# Patient Record
Sex: Female | Born: 1945 | Race: Black or African American | Hispanic: No | Marital: Single | Smoking: Current every day smoker
Health system: Southern US, Community
[De-identification: ages and names within clinical notes are randomized; demographics above are authoritative.]

## PROBLEM LIST (undated history)

## (undated) DIAGNOSIS — K859 Acute pancreatitis without necrosis or infection, unspecified: Secondary | ICD-10-CM

## (undated) DIAGNOSIS — E119 Type 2 diabetes mellitus without complications: Secondary | ICD-10-CM

## (undated) DIAGNOSIS — I10 Essential (primary) hypertension: Secondary | ICD-10-CM

## (undated) DIAGNOSIS — E7800 Pure hypercholesterolemia, unspecified: Secondary | ICD-10-CM

## (undated) HISTORY — PX: THYROGLOSSAL DUCT CYST: SHX297

## (undated) HISTORY — DX: Acute pancreatitis without necrosis or infection, unspecified: K85.90

## (undated) HISTORY — PX: SHOULDER OPEN ROTATOR CUFF REPAIR: SHX2407

---

## 1984-05-13 HISTORY — PX: ABDOMINAL HYSTERECTOMY: SHX81

## 2011-11-06 ENCOUNTER — Encounter (INDEPENDENT_AMBULATORY_CARE_PROVIDER_SITE_OTHER): Payer: Medicare Other | Admitting: Ophthalmology

## 2011-11-06 DIAGNOSIS — H43819 Vitreous degeneration, unspecified eye: Secondary | ICD-10-CM

## 2011-11-06 DIAGNOSIS — H2510 Age-related nuclear cataract, unspecified eye: Secondary | ICD-10-CM

## 2011-11-06 DIAGNOSIS — H3509 Other intraretinal microvascular abnormalities: Secondary | ICD-10-CM

## 2011-11-06 DIAGNOSIS — H4310 Vitreous hemorrhage, unspecified eye: Secondary | ICD-10-CM

## 2011-11-08 DIAGNOSIS — H4310 Vitreous hemorrhage, unspecified eye: Secondary | ICD-10-CM

## 2011-11-08 NOTE — H&P (Signed)
Stacy Hinton is an 66 y.o. female.   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  No past surgical history on file.  No family history on file. Social History:  does not have a smoking history on file. She does not have any smokeless tobacco history on file. Her alcohol and drug histories not on file.  Allergies: Allergies not on file  No prescriptions prior to admission    Review of systems otherwise negative  There were no vitals taken for this visit.  Physical exam: Mental status: oriented x3. Eyes: See eye exam associated with this date of surgery in media tab.  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

## 2011-11-12 ENCOUNTER — Encounter (HOSPITAL_COMMUNITY): Payer: Self-pay | Admitting: Pharmacy Technician

## 2011-11-18 ENCOUNTER — Encounter (HOSPITAL_COMMUNITY): Payer: Self-pay | Admitting: *Deleted

## 2011-11-18 MED ORDER — GATIFLOXACIN 0.5 % OP SOLN
1.0000 [drp] | OPHTHALMIC | Status: AC | PRN
  Administered 2011-11-19 (×3): 1 [drp] via OPHTHALMIC
  Filled 2011-11-18: qty 2.5

## 2011-11-18 MED ORDER — PHENYLEPHRINE HCL 2.5 % OP SOLN
1.0000 [drp] | OPHTHALMIC | Status: AC | PRN
  Administered 2011-11-19 (×3): 1 [drp] via OPHTHALMIC
  Filled 2011-11-18: qty 3

## 2011-11-18 MED ORDER — CYCLOPENTOLATE HCL 1 % OP SOLN
1.0000 [drp] | OPHTHALMIC | Status: AC | PRN
  Administered 2011-11-19 (×3): 1 [drp] via OPHTHALMIC
  Filled 2011-11-18: qty 2

## 2011-11-18 MED ORDER — CEFAZOLIN SODIUM-DEXTROSE 2-3 GM-% IV SOLR
2.0000 g | INTRAVENOUS | Status: AC
  Administered 2011-11-19: 2 g via INTRAVENOUS
  Filled 2011-11-18: qty 50

## 2011-11-18 MED ORDER — TROPICAMIDE 1 % OP SOLN
1.0000 [drp] | OPHTHALMIC | Status: AC | PRN
  Administered 2011-11-19 (×3): 1 [drp] via OPHTHALMIC
  Filled 2011-11-18 (×2): qty 3

## 2011-11-19 ENCOUNTER — Encounter (HOSPITAL_COMMUNITY)
Admission: RE | Disposition: A | Discharge status: Home / Self Care | Payer: Self-pay | Source: Ambulatory Visit | Attending: Ophthalmology

## 2011-11-19 ENCOUNTER — Ambulatory Visit (HOSPITAL_COMMUNITY): Payer: Medicare Other | Admitting: Certified Registered"

## 2011-11-19 ENCOUNTER — Ambulatory Visit (HOSPITAL_COMMUNITY)
Admission: RE | Admit: 2011-11-19 | Discharge: 2011-11-20 | Disposition: A | Discharge status: Home / Self Care | Payer: Medicare Other | Source: Ambulatory Visit | Attending: Ophthalmology | Admitting: Ophthalmology

## 2011-11-19 ENCOUNTER — Ambulatory Visit (HOSPITAL_COMMUNITY): Payer: Medicare Other

## 2011-11-19 ENCOUNTER — Encounter (HOSPITAL_COMMUNITY): Payer: Self-pay | Admitting: Certified Registered"

## 2011-11-19 ENCOUNTER — Encounter (HOSPITAL_COMMUNITY): Payer: Self-pay | Admitting: *Deleted

## 2011-11-19 DIAGNOSIS — E109 Type 1 diabetes mellitus without complications: Secondary | ICD-10-CM | POA: Insufficient documentation

## 2011-11-19 DIAGNOSIS — H3560 Retinal hemorrhage, unspecified eye: Secondary | ICD-10-CM

## 2011-11-19 DIAGNOSIS — I10 Essential (primary) hypertension: Secondary | ICD-10-CM | POA: Insufficient documentation

## 2011-11-19 DIAGNOSIS — H4310 Vitreous hemorrhage, unspecified eye: Secondary | ICD-10-CM | POA: Insufficient documentation

## 2011-11-19 DIAGNOSIS — Z23 Encounter for immunization: Secondary | ICD-10-CM | POA: Insufficient documentation

## 2011-11-19 HISTORY — DX: Pure hypercholesterolemia, unspecified: E78.00

## 2011-11-19 HISTORY — PX: GAS/FLUID EXCHANGE: SHX5334

## 2011-11-19 HISTORY — DX: Type 2 diabetes mellitus without complications: E11.9

## 2011-11-19 HISTORY — PX: PARS PLANA VITRECTOMY: SHX2166

## 2011-11-19 HISTORY — DX: Essential (primary) hypertension: I10

## 2011-11-19 LAB — BASIC METABOLIC PANEL
CO2: 26 mEq/L (ref 19–32)
Calcium: 10 mg/dL (ref 8.4–10.5)
GFR calc Af Amer: 59 mL/min — ABNORMAL LOW (ref >90)
GFR calc non Af Amer: 51 mL/min — ABNORMAL LOW (ref >90)
Sodium: 140 mEq/L (ref 135–145)

## 2011-11-19 LAB — SURGICAL PCR SCREEN
MRSA, PCR: NEGATIVE
Staphylococcus aureus: NEGATIVE

## 2011-11-19 LAB — CBC
Platelets: 264 10*3/uL (ref 150–400)
RBC: 4.39 MIL/uL (ref 3.87–5.11)
RDW: 13.3 % (ref 11.5–15.5)
WBC: 4.6 10*3/uL (ref 4.0–10.5)

## 2011-11-19 LAB — GLUCOSE, CAPILLARY: Glucose-Capillary: 104 mg/dL — ABNORMAL HIGH (ref 70–99)

## 2011-11-19 SURGERY — PARS PLANA VITRECTOMY WITH 25 GAUGE
Anesthesia: General | Site: Eye | Laterality: Left | Wound class: Clean

## 2011-11-19 MED ORDER — SODIUM CHLORIDE 0.45 % IV SOLN
INTRAVENOUS | Status: DC
  Administered 2011-11-19: 15:00:00 via INTRAVENOUS

## 2011-11-19 MED ORDER — ACETAMINOPHEN 325 MG PO TABS: 325.0000 mg | ORAL_TABLET | ORAL | Status: DC | PRN

## 2011-11-19 MED ORDER — ONDANSETRON HCL 4 MG/2ML IJ SOLN: 4.0000 mg | Freq: Four times a day (QID) | INTRAMUSCULAR | Status: DC | PRN

## 2011-11-19 MED ORDER — BSS PLUS IO SOLN
INTRAOCULAR | Status: AC
  Filled 2011-11-19: qty 500

## 2011-11-19 MED ORDER — PNEUMOCOCCAL VAC POLYVALENT 25 MCG/0.5ML IJ INJ
0.5000 mL | INJECTION | INTRAMUSCULAR | Status: AC
  Administered 2011-11-20: 0.5 mL via INTRAMUSCULAR
  Filled 2011-11-19: qty 0.5

## 2011-11-19 MED ORDER — PROVISC 10 MG/ML IO SOLN
INTRAOCULAR | Status: DC | PRN
  Administered 2011-11-19: .85 mL via INTRAOCULAR

## 2011-11-19 MED ORDER — POLYMYXIN B SULFATE 500000 UNITS IJ SOLR
INTRAMUSCULAR | Status: AC
  Filled 2011-11-19: qty 1

## 2011-11-19 MED ORDER — PHENYLEPHRINE HCL 10 MG/ML IJ SOLN
INTRAMUSCULAR | Status: DC | PRN
  Administered 2011-11-19: 40 ug via INTRAVENOUS

## 2011-11-19 MED ORDER — KETOROLAC TROMETHAMINE 0.4 % OP SOLN: 1.0000 [drp] | Freq: Three times a day (TID) | OPHTHALMIC | Status: DC

## 2011-11-19 MED ORDER — BSS IO SOLN
INTRAOCULAR | Status: AC
  Filled 2011-11-19: qty 15

## 2011-11-19 MED ORDER — ONDANSETRON HCL 4 MG/2ML IJ SOLN: 4.0000 mg | Freq: Once | INTRAMUSCULAR | Status: DC | PRN

## 2011-11-19 MED ORDER — ACETAZOLAMIDE SODIUM 500 MG IJ SOLR
INTRAMUSCULAR | Status: AC
  Filled 2011-11-19: qty 500

## 2011-11-19 MED ORDER — ROCURONIUM BROMIDE 100 MG/10ML IV SOLN
INTRAVENOUS | Status: DC | PRN
  Administered 2011-11-19: 50 mg via INTRAVENOUS

## 2011-11-19 MED ORDER — GATIFLOXACIN 0.5 % OP SOLN
1.0000 [drp] | Freq: Four times a day (QID) | OPHTHALMIC | Status: DC
  Filled 2011-11-19: qty 2.5

## 2011-11-19 MED ORDER — BACITRACIN-POLYMYXIN B 500-10000 UNIT/GM OP OINT
TOPICAL_OINTMENT | OPHTHALMIC | Status: DC | PRN
  Administered 2011-11-19: 1 via OPHTHALMIC

## 2011-11-19 MED ORDER — HEMOSTATIC AGENTS (NO CHARGE) OPTIME
TOPICAL | Status: DC | PRN
  Administered 2011-11-19: 1 via TOPICAL

## 2011-11-19 MED ORDER — DOCUSATE SODIUM 100 MG PO CAPS
100.0000 mg | ORAL_CAPSULE | Freq: Two times a day (BID) | ORAL | Status: DC
  Administered 2011-11-19: 100 mg via ORAL
  Filled 2011-11-19: qty 1

## 2011-11-19 MED ORDER — EPINEPHRINE HCL 1 MG/ML IJ SOLN
INTRAMUSCULAR | Status: AC
  Filled 2011-11-19: qty 1

## 2011-11-19 MED ORDER — MORPHINE SULFATE 2 MG/ML IJ SOLN: 1.0000 mg | INTRAMUSCULAR | Status: DC | PRN

## 2011-11-19 MED ORDER — HYPROMELLOSE (GONIOSCOPIC) 2.5 % OP SOLN
OPHTHALMIC | Status: AC
  Filled 2011-11-19: qty 15

## 2011-11-19 MED ORDER — ACETAZOLAMIDE SODIUM 500 MG IJ SOLR
500.0000 mg | Freq: Once | INTRAMUSCULAR | Status: AC
  Administered 2011-11-20: 500 mg via INTRAVENOUS
  Filled 2011-11-19: qty 500

## 2011-11-19 MED ORDER — DEXAMETHASONE SODIUM PHOSPHATE 10 MG/ML IJ SOLN
INTRAMUSCULAR | Status: DC | PRN
  Administered 2011-11-19: 10 mg

## 2011-11-19 MED ORDER — METFORMIN HCL 500 MG PO TABS
500.0000 mg | ORAL_TABLET | Freq: Two times a day (BID) | ORAL | Status: DC
  Administered 2011-11-19 – 2011-11-20 (×2): 500 mg via ORAL
  Filled 2011-11-19 (×4): qty 1

## 2011-11-19 MED ORDER — HYALURONIDASE HUMAN 150 UNIT/ML IJ SOLN
INTRAMUSCULAR | Status: AC
  Filled 2011-11-19: qty 1

## 2011-11-19 MED ORDER — LIDOCAINE HCL 2 % IJ SOLN
INTRAMUSCULAR | Status: AC
  Filled 2011-11-19: qty 1

## 2011-11-19 MED ORDER — INSULIN ASPART 100 UNIT/ML ~~LOC~~ SOLN
0.0000 [IU] | SUBCUTANEOUS | Status: DC
  Administered 2011-11-19 (×2): 5 [IU] via SUBCUTANEOUS
  Administered 2011-11-20 (×2): 2 [IU] via SUBCUTANEOUS
  Administered 2011-11-20: 3 [IU] via SUBCUTANEOUS

## 2011-11-19 MED ORDER — MAGNESIUM HYDROXIDE 400 MG/5ML PO SUSP: 15.0000 mL | Freq: Four times a day (QID) | ORAL | Status: DC | PRN

## 2011-11-19 MED ORDER — SODIUM CHLORIDE 0.9 % IJ SOLN
INTRAMUSCULAR | Status: DC | PRN
  Administered 2011-11-19: 13:00:00

## 2011-11-19 MED ORDER — DEXAMETHASONE SODIUM PHOSPHATE 10 MG/ML IJ SOLN
INTRAMUSCULAR | Status: AC
  Filled 2011-11-19: qty 1

## 2011-11-19 MED ORDER — TETRACAINE HCL 0.5 % OP SOLN
2.0000 [drp] | Freq: Once | OPHTHALMIC | Status: DC
  Filled 2011-11-19: qty 2

## 2011-11-19 MED ORDER — BUPIVACAINE HCL 0.75 % IJ SOLN
INTRAMUSCULAR | Status: DC | PRN
  Administered 2011-11-19: 10 mL

## 2011-11-19 MED ORDER — MUPIROCIN 2 % EX OINT
TOPICAL_OINTMENT | CUTANEOUS | Status: AC
  Administered 2011-11-19: 1 via NASAL
  Filled 2011-11-19: qty 22

## 2011-11-19 MED ORDER — TEMAZEPAM 15 MG PO CAPS: 15.0000 mg | ORAL_CAPSULE | Freq: Every evening | ORAL | Status: DC | PRN

## 2011-11-19 MED ORDER — LATANOPROST 0.005 % OP SOLN
1.0000 [drp] | Freq: Every day | OPHTHALMIC | Status: DC
  Filled 2011-11-19: qty 2.5

## 2011-11-19 MED ORDER — FENTANYL CITRATE 0.05 MG/ML IJ SOLN
INTRAMUSCULAR | Status: DC | PRN
Start: 2011-11-19 — End: 2011-11-19
  Administered 2011-11-19: 100 ug via INTRAVENOUS

## 2011-11-19 MED ORDER — SODIUM HYALURONATE 10 MG/ML IO SOLN
INTRAOCULAR | Status: AC
  Filled 2011-11-19: qty 0.85

## 2011-11-19 MED ORDER — EPINEPHRINE HCL 1 MG/ML IJ SOLN
INTRAOCULAR | Status: DC | PRN
  Administered 2011-11-19: 13:00:00

## 2011-11-19 MED ORDER — BSS IO SOLN
INTRAOCULAR | Status: DC | PRN
  Administered 2011-11-19: 15 mL via INTRAOCULAR

## 2011-11-19 MED ORDER — BUPIVACAINE HCL 0.75 % IJ SOLN
INTRAMUSCULAR | Status: AC
  Filled 2011-11-19: qty 10

## 2011-11-19 MED ORDER — GENTAMICIN SULFATE 40 MG/ML IJ SOLN
INTRAMUSCULAR | Status: AC
  Filled 2011-11-19: qty 2

## 2011-11-19 MED ORDER — MINERAL OIL LIGHT 100 % EX OIL
TOPICAL_OIL | CUTANEOUS | Status: AC
  Filled 2011-11-19: qty 25

## 2011-11-19 MED ORDER — NEOSTIGMINE METHYLSULFATE 1 MG/ML IJ SOLN
INTRAMUSCULAR | Status: DC | PRN
  Administered 2011-11-19: 5 mg via INTRAVENOUS

## 2011-11-19 MED ORDER — ONDANSETRON HCL 4 MG/2ML IJ SOLN
INTRAMUSCULAR | Status: DC | PRN
  Administered 2011-11-19: 4 mg via INTRAVENOUS

## 2011-11-19 MED ORDER — BACITRACIN-POLYMYXIN B 500-10000 UNIT/GM OP OINT
TOPICAL_OINTMENT | OPHTHALMIC | Status: AC
  Filled 2011-11-19: qty 3.5

## 2011-11-19 MED ORDER — SODIUM CHLORIDE 0.9 % IR SOLN
Status: DC | PRN
  Administered 2011-11-19: 1

## 2011-11-19 MED ORDER — BSS PLUS IO SOLN
INTRAOCULAR | Status: DC | PRN
  Administered 2011-11-19: 1 via INTRAOCULAR

## 2011-11-19 MED ORDER — STERILE WATER FOR IRRIGATION IR SOLN
Status: DC | PRN
  Administered 2011-11-19: 1

## 2011-11-19 MED ORDER — PROPOFOL 10 MG/ML IV EMUL
INTRAVENOUS | Status: DC | PRN
  Administered 2011-11-19: 200 mg via INTRAVENOUS

## 2011-11-19 MED ORDER — ATROPINE SULFATE 1 % OP SOLN
OPHTHALMIC | Status: DC | PRN
  Administered 2011-11-19: 2 [drp] via OPHTHALMIC

## 2011-11-19 MED ORDER — SODIUM CHLORIDE 0.9 % IV SOLN
INTRAVENOUS | Status: DC
  Administered 2011-11-19: 11:00:00 via INTRAVENOUS

## 2011-11-19 MED ORDER — HYDROCODONE-ACETAMINOPHEN 5-325 MG PO TABS: 1.0000 | ORAL_TABLET | ORAL | Status: DC | PRN

## 2011-11-19 MED ORDER — TRIAMCINOLONE ACETONIDE 40 MG/ML IJ SUSP
INTRAMUSCULAR | Status: AC
  Filled 2011-11-19: qty 1

## 2011-11-19 MED ORDER — ATROPINE SULFATE 1 % OP SOLN
OPHTHALMIC | Status: AC
  Filled 2011-11-19: qty 2

## 2011-11-19 MED ORDER — PREDNISOLONE ACETATE 1 % OP SUSP
1.0000 [drp] | Freq: Four times a day (QID) | OPHTHALMIC | Status: DC
  Filled 2011-11-19: qty 5
  Filled 2011-11-19: qty 1

## 2011-11-19 MED ORDER — LISINOPRIL 5 MG PO TABS
5.0000 mg | ORAL_TABLET | Freq: Every day | ORAL | Status: DC
  Administered 2011-11-19: 5 mg via ORAL
  Filled 2011-11-19 (×2): qty 1

## 2011-11-19 MED ORDER — INSULIN GLARGINE 100 UNIT/ML ~~LOC~~ SOLN
5.0000 [IU] | Freq: Every day | SUBCUTANEOUS | Status: DC
  Administered 2011-11-19: 5 [IU] via SUBCUTANEOUS

## 2011-11-19 MED ORDER — GLYCOPYRROLATE 0.2 MG/ML IJ SOLN
INTRAMUSCULAR | Status: DC | PRN
  Administered 2011-11-19: 0.6 mg via INTRAVENOUS

## 2011-11-19 MED ORDER — HYDROMORPHONE HCL PF 1 MG/ML IJ SOLN: 0.2500 mg | INTRAMUSCULAR | Status: DC | PRN

## 2011-11-19 MED ORDER — KETOROLAC TROMETHAMINE 0.5 % OP SOLN
1.0000 [drp] | Freq: Three times a day (TID) | OPHTHALMIC | Status: DC
  Filled 2011-11-19: qty 3

## 2011-11-19 MED ORDER — BRIMONIDINE TARTRATE 0.2 % OP SOLN
1.0000 [drp] | Freq: Two times a day (BID) | OPHTHALMIC | Status: DC
  Filled 2011-11-19: qty 5

## 2011-11-19 MED ORDER — MIDAZOLAM HCL 5 MG/5ML IJ SOLN
INTRAMUSCULAR | Status: DC | PRN
  Administered 2011-11-19: 2 mg via INTRAVENOUS

## 2011-11-19 MED ORDER — SIMVASTATIN 20 MG PO TABS
20.0000 mg | ORAL_TABLET | Freq: Every evening | ORAL | Status: DC
  Administered 2011-11-19: 20 mg via ORAL
  Filled 2011-11-19 (×2): qty 1

## 2011-11-19 MED ORDER — ACETAMINOPHEN 500 MG PO TABS
500.0000 mg | ORAL_TABLET | Freq: Every day | ORAL | Status: DC
  Administered 2011-11-19: 500 mg via ORAL
  Filled 2011-11-19 (×2): qty 1

## 2011-11-19 MED ORDER — BACITRACIN-POLYMYXIN B 500-10000 UNIT/GM OP OINT
1.0000 "application " | TOPICAL_OINTMENT | Freq: Four times a day (QID) | OPHTHALMIC | Status: DC
  Filled 2011-11-19: qty 3.5

## 2011-11-19 SURGICAL SUPPLY — 65 items
APPLICATOR DR MATTHEWS STRL (MISCELLANEOUS) IMPLANT
BLADE EYE CATARACT 19 1.4 BEAV (BLADE) IMPLANT
BLADE MVR KNIFE 19G (BLADE) IMPLANT
BLADE MVR KNIFE 20G (BLADE) IMPLANT
CANNULA DUAL BORE 23G (CANNULA) IMPLANT
CANNULA FLEX TIP 25G (CANNULA) ×2 IMPLANT
CLOTH BEACON ORANGE TIMEOUT ST (SAFETY) ×2 IMPLANT
CORDS BIPOLAR (ELECTRODE) IMPLANT
COTTONBALL LRG STERILE PKG (GAUZE/BANDAGES/DRESSINGS) ×6 IMPLANT
DRAPE INCISE 51X51 W/FILM STRL (DRAPES) ×2 IMPLANT
DRAPE OPHTHALMIC 77X100 STRL (CUSTOM PROCEDURE TRAY) ×2 IMPLANT
FILTER BLUE MILLIPORE (MISCELLANEOUS) IMPLANT
FILTER STRAW FLUID ASPIR (MISCELLANEOUS) ×2 IMPLANT
FORCEPS ECKARDT ILM 25G SERR (OPHTHALMIC RELATED) IMPLANT
GLOVE SS BIOGEL STRL SZ 6.5 (GLOVE) ×1 IMPLANT
GLOVE SS BIOGEL STRL SZ 7 (GLOVE) ×1 IMPLANT
GLOVE SUPERSENSE BIOGEL SZ 6.5 (GLOVE) ×1
GLOVE SUPERSENSE BIOGEL SZ 7 (GLOVE) ×1
GLOVE SURG 8.5 LATEX PF (GLOVE) ×2 IMPLANT
GLOVE SURG SS PI 6.5 STRL IVOR (GLOVE) ×4 IMPLANT
GOWN STRL NON-REIN LRG LVL3 (GOWN DISPOSABLE) ×8 IMPLANT
ILLUMINATOR CHOW PICK 25GA (MISCELLANEOUS) ×2 IMPLANT
KIT BASIN OR (CUSTOM PROCEDURE TRAY) ×2 IMPLANT
KIT ROOM TURNOVER OR (KITS) ×2 IMPLANT
KNIFE CRESCENT 2.5 55 ANG (BLADE) IMPLANT
LENS BIOM SUPER VIEW SET DISP (OPHTHALMIC RELATED) IMPLANT
MARKER SKIN DUAL TIP RULER LAB (MISCELLANEOUS) IMPLANT
MASK EYE SHIELD (GAUZE/BANDAGES/DRESSINGS) ×2 IMPLANT
MICROPICK 25G (MISCELLANEOUS)
NEEDLE 18GX1X1/2 (RX/OR ONLY) (NEEDLE) ×2 IMPLANT
NEEDLE 25GX 5/8IN NON SAFETY (NEEDLE) ×2 IMPLANT
NEEDLE 27GAX1X1/2 (NEEDLE) IMPLANT
NEEDLE FILTER BLUNT 18X 1/2SAF (NEEDLE) ×1
NEEDLE FILTER BLUNT 18X1 1/2 (NEEDLE) ×1 IMPLANT
NEEDLE HYPO 30X.5 LL (NEEDLE) ×2 IMPLANT
NS IRRIG 1000ML POUR BTL (IV SOLUTION) ×2 IMPLANT
PACK VITRECTOMY CUSTOM (CUSTOM PROCEDURE TRAY) ×2 IMPLANT
PAD ARMBOARD 7.5X6 YLW CONV (MISCELLANEOUS) ×4 IMPLANT
PAD EYE OVAL STERILE LF (GAUZE/BANDAGES/DRESSINGS) ×2 IMPLANT
PAK VITRECTOMY PIK 25 GA (OPHTHALMIC RELATED) ×2 IMPLANT
PENCIL BIPOLAR 25GA STR DISP (OPHTHALMIC RELATED) IMPLANT
PICK MICROPICK 25G (MISCELLANEOUS) IMPLANT
PROBE DIRECTIONAL LASER (MISCELLANEOUS) IMPLANT
REPL STRA BRUSH NEEDLE (NEEDLE) IMPLANT
RESERVOIR BACK FLUSH (MISCELLANEOUS) IMPLANT
ROLLS DENTAL (MISCELLANEOUS) ×4 IMPLANT
SCRAPER DIAMOND DUST MEMBRANE (MISCELLANEOUS) IMPLANT
SPONGE SURGIFOAM ABS GEL 12-7 (HEMOSTASIS) ×2 IMPLANT
STOPCOCK 4 WAY LG BORE MALE ST (IV SETS) IMPLANT
SUT CHROMIC 7 0 TG140 8 (SUTURE) IMPLANT
SUT ETHILON 10 0 CS140 6 (SUTURE) IMPLANT
SUT ETHILON 9 0 TG140 8 (SUTURE) IMPLANT
SUT POLY NON ABSORB 10-0 8 STR (SUTURE) IMPLANT
SUT SILK 4 0 RB 1 (SUTURE) IMPLANT
SYR 20CC LL (SYRINGE) ×2 IMPLANT
SYR 5ML LL (SYRINGE) IMPLANT
SYR BULB 3OZ (MISCELLANEOUS) ×2 IMPLANT
SYR TB 1ML LUER SLIP (SYRINGE) ×2 IMPLANT
SYRINGE 10CC LL (SYRINGE) IMPLANT
TAPE SURG TRANSPORE 1 IN (GAUZE/BANDAGES/DRESSINGS) ×1 IMPLANT
TAPE SURGICAL TRANSPORE 1 IN (GAUZE/BANDAGES/DRESSINGS) ×1
TOWEL OR 17X24 6PK STRL BLUE (TOWEL DISPOSABLE) ×6 IMPLANT
TROCAR CANNULA 25GA (CANNULA) IMPLANT
WATER STERILE IRR 1000ML POUR (IV SOLUTION) ×2 IMPLANT
WIPE INSTRUMENT VISIWIPE 73X73 (MISCELLANEOUS) ×2 IMPLANT

## 2011-11-19 NOTE — H&P (Signed)
I examined the patient today and there is no change in the medical status 

## 2011-11-19 NOTE — Anesthesia Procedure Notes (Signed)
Procedure Name: Intubation Date/Time: 11/19/2011 11:59 AM Performed by: Ellin Goodie Pre-anesthesia Checklist: Patient identified, Emergency Drugs available, Suction available, Patient being monitored and Timeout performed Patient Re-evaluated:Patient Re-evaluated prior to inductionOxygen Delivery Method: Circle system utilized Preoxygenation: Pre-oxygenation with 100% oxygen Intubation Type: IV induction Ventilation: Mask ventilation without difficulty Laryngoscope Size: Mac and 3 Grade View: Grade I Tube type: Oral Number of attempts: 1 Airway Equipment and Method: Stylet Placement Confirmation: ETT inserted through vocal cords under direct vision,  positive ETCO2 and breath sounds checked- equal and bilateral Secured at: 22 cm Tube secured with: Tape Dental Injury: Teeth and Oropharynx as per pre-operative assessment

## 2011-11-19 NOTE — Op Note (Signed)
[name], Stacy Hinton                 ACCOUNT NO.:  [id number]  MEDICAL RECORD NO.:  [id number]  LOCATION:  [facility]                         FACILITY:  [facility]  PHYSICIAN:  Beulah Gandy. Ashley Royalty, M.D. DATE OF BIRTH:  March 20, 1946  DATE OF PROCEDURE:  11/19/2011 DATE OF DISCHARGE:                              OPERATIVE REPORT   DIAGNOSIS:  Vitreous hemorrhage, preretinal hemorrhage, arterial macroaneurysm, left eye.  PROCEDURES:  Pars plana vitrectomy, retinal photocoagulation, membrane peel, gas-fluid exchange, all in the left eye.  SURGEON:  Beulah Gandy. Ashley Royalty, M.D.  ASSISTANT:  Rosalie Doctor SA.  ANESTHESIA:  General.  DETAILS:  Usual prep and drape, the indirect ophthalmoscope laser was moved into place, 809 burns were placed around the retinal periphery, surrounding weak areas in the peripheral retina.  The power was between 440 mW, size 1000 microns each, and 0.1 seconds each.  Once this was accomplished, the attention was carried to the pars plana area where 25- gauge trocars were placed at 10, 2, and 4 o'clock with infusion at 4 o'clock.  The Provisc was placed on the corneal surface.  The pars plana vitrectomy was begun just behind the crystalline lens.  The BIOM viewing system was moved into place.  The vitrectomy was carried posteriorly in a core fashion, removing vitreous hemorrhage.  A dome of subhyaloid hemorrhage was encountered in the macular region.  This dome was incised with the lighted pick and silicone-tip suction cutter.  This was the posterior hyaloid with a pocket of blood clot beneath it on the foveal surface.  The blood was carefully removed layer by layer until the foveal surface became visible.  There was a large central yellow-white lipid area in the central fovea.  The hyaloid was carefully removed out to until it became smoothed with the retina.  An area of arterial macroaneurysm and subretinal hemorrhage which had turned green was seen. There was no attempt to  remove this hemorrhage.  The vitrectomy was carried to the mid periphery where additional vitreous was removed then in the far periphery down to the vitreous base.  A 30% gas-fluid exchange was performed.  The instruments were removed from the eye and 25-gauge trocars were removed.  The wounds were tested and found to be secure.  Polymyxin and gentamicin were irrigated into tenon space. Atropine solution was applied.  Decadron 10 mg was injected into the lower subconjunctival space.  Polysporin patch and shield were placed. The closing pressure was 10 with a Barraquer tonometer.  The patient was awakened, taken to recovery in satisfactory condition.    Beulah Gandy. Ashley Royalty, M.D.    JDM/MEDQ  D:  11/19/2011  T:  11/19/2011  Job:  [id number]

## 2011-11-19 NOTE — Brief Op Note (Signed)
Brief Operative note   Preoperative diagnosis:  Pre-Op Diagnosis Codes:    * Vitreous hemorrhage [379.23] Postoperative diagnosis  Post-Op Diagnosis Codes:    * Vitreous hemorrhage [379.23]  Procedures: Pars plana vitrectomy, laser treatment, gas injection, membrane peel  Surgeon:  Sherrie George, MD...  Assistant:  Rosalie Doctor SA  Charyl Dancer RN  Anesthesia: General  Specimen: none  Estimated blood loss:  1cc  Complications: none  Patient sent to PACU in good condition  Composed by Sherrie George MD  Dictation number: [id number]

## 2011-11-19 NOTE — Transfer of Care (Signed)
Immediate Anesthesia Transfer of Care Note  Patient: Stacy Hinton  Procedure(s) Performed: Procedure(s) (LRB): PARS PLANA VITRECTOMY WITH 25 GAUGE (Left) MEMBRANE PEEL (Left) GAS/FLUID EXCHANGE (Left)  Patient Location: PACU  Anesthesia Type: General  Level of Consciousness: sedated  Airway & Oxygen Therapy: Patient Spontanous Breathing  Post-op Assessment: Report given to PACU RN  Post vital signs: stable  Complications: No apparent anesthesia complications

## 2011-11-19 NOTE — Anesthesia Postprocedure Evaluation (Signed)
  Anesthesia Post-op Note  Patient: Stacy Hinton  Procedure(s) Performed: Procedure(s) (LRB): PARS PLANA VITRECTOMY WITH 25 GAUGE (Left) MEMBRANE PEEL (Left) GAS/FLUID EXCHANGE (Left)  Patient Location: PACU  Anesthesia Type: General  Level of Consciousness: awake, alert  and oriented  Airway and Oxygen Therapy: Patient Spontanous Breathing and Patient connected to nasal cannula oxygen  Post-op Pain: mild  Post-op Assessment: Post-op Vital signs reviewed  Post-op Vital Signs: Reviewed  Complications: No apparent anesthesia complications

## 2011-11-19 NOTE — Anesthesia Preprocedure Evaluation (Addendum)
Anesthesia Evaluation  Patient identified by MRN, date of birth, ID band Patient awake    Reviewed: Allergy & Precautions, H&P , NPO status , Patient's Chart, lab work & pertinent test results, reviewed documented beta blocker date and time   Airway Mallampati: II TM Distance: <3 FB Neck ROM: Full    Dental  (+) Poor Dentition, Dental Advisory Given and Teeth Intact   Pulmonary neg pulmonary ROS,  breath sounds clear to auscultation        Cardiovascular hypertension, Pt. on medications Rhythm:Regular Rate:Normal     Neuro/Psych negative neurological ROS  negative psych ROS   GI/Hepatic negative GI ROS, Neg liver ROS,   Endo/Other  Well Controlled, Type 1, Oral Hypoglycemic Agents  Renal/GU negative Renal ROS     Musculoskeletal   Abdominal (+)  Abdomen: soft. Bowel sounds: normal.  Peds  Hematology   Anesthesia Other Findings   Reproductive/Obstetrics                         Anesthesia Physical Anesthesia Plan  ASA: III  Anesthesia Plan: General   Post-op Pain Management:    Induction: Intravenous  Airway Management Planned: Oral ETT  Additional Equipment:   Intra-op Plan:   Post-operative Plan:   Informed Consent: I have reviewed the patients History and Physical, chart, labs and discussed the procedure including the risks, benefits and alternatives for the proposed anesthesia with the patient or authorized representative who has indicated his/her understanding and acceptance.   Dental advisory given  Plan Discussed with: CRNA, Anesthesiologist and Surgeon  Anesthesia Plan Comments:         Anesthesia Quick Evaluation

## 2011-11-20 ENCOUNTER — Encounter (HOSPITAL_COMMUNITY): Payer: Self-pay | Admitting: Ophthalmology

## 2011-11-20 LAB — GLUCOSE, CAPILLARY: Glucose-Capillary: 126 mg/dL — ABNORMAL HIGH (ref 70–99)

## 2011-11-20 MED ORDER — GATIFLOXACIN 0.5 % OP SOLN: 1.0000 [drp] | Freq: Four times a day (QID) | OPHTHALMIC | Status: DC

## 2011-11-20 MED ORDER — BACITRACIN-POLYMYXIN B 500-10000 UNIT/GM OP OINT: 1.0000 "application " | TOPICAL_OINTMENT | Freq: Four times a day (QID) | OPHTHALMIC | Status: AC

## 2011-11-20 MED ORDER — KETOROLAC TROMETHAMINE 0.5 % OP SOLN: 1.0000 [drp] | Freq: Three times a day (TID) | OPHTHALMIC | Status: AC

## 2011-11-20 MED ORDER — PREDNISOLONE ACETATE 1 % OP SUSP: 1.0000 [drp] | Freq: Four times a day (QID) | OPHTHALMIC | Status: AC

## 2011-11-20 NOTE — Discharge Summary (Signed)
Discharge summary not needed on [facility] patients per medical records. 

## 2011-11-20 NOTE — Progress Notes (Signed)
11/20/2011, 6:48 AM  Mental Status:  Awake, Alert, Oriented  Anterior segment: Cornea  Clear    Anterior Chamber Clear    Lens:   Clear  Intra Ocular Pressure 11 mmHg with Tonopen  Vitreous: Clear  Retina:  Attached Good laser reaction   Impression: Excellent result Retina attached   Final Diagnosis: Principal Problem:  *Vitreous hemorrhage   Plan: start post operative eye drops.  Discharge to home.  Give post operative instructions  Sherrie George 11/20/2011, 6:48 AM

## 2011-11-26 ENCOUNTER — Ambulatory Visit (INDEPENDENT_AMBULATORY_CARE_PROVIDER_SITE_OTHER): Payer: Medicare Other | Admitting: Ophthalmology

## 2011-11-26 DIAGNOSIS — H35039 Hypertensive retinopathy, unspecified eye: Secondary | ICD-10-CM

## 2011-11-26 DIAGNOSIS — I10 Essential (primary) hypertension: Secondary | ICD-10-CM

## 2011-11-26 DIAGNOSIS — H3509 Other intraretinal microvascular abnormalities: Secondary | ICD-10-CM

## 2011-12-09 ENCOUNTER — Ambulatory Visit (INDEPENDENT_AMBULATORY_CARE_PROVIDER_SITE_OTHER): Payer: Medicare Other | Admitting: Ophthalmology

## 2011-12-18 ENCOUNTER — Encounter (INDEPENDENT_AMBULATORY_CARE_PROVIDER_SITE_OTHER): Payer: Medicare Other | Admitting: Ophthalmology

## 2011-12-18 DIAGNOSIS — H3509 Other intraretinal microvascular abnormalities: Secondary | ICD-10-CM

## 2012-02-26 ENCOUNTER — Encounter (INDEPENDENT_AMBULATORY_CARE_PROVIDER_SITE_OTHER): Payer: Medicare Other | Admitting: Ophthalmology

## 2012-02-26 DIAGNOSIS — H2510 Age-related nuclear cataract, unspecified eye: Secondary | ICD-10-CM

## 2012-02-26 DIAGNOSIS — H35039 Hypertensive retinopathy, unspecified eye: Secondary | ICD-10-CM

## 2012-02-26 DIAGNOSIS — H43819 Vitreous degeneration, unspecified eye: Secondary | ICD-10-CM

## 2012-02-26 DIAGNOSIS — H3509 Other intraretinal microvascular abnormalities: Secondary | ICD-10-CM

## 2012-02-26 DIAGNOSIS — I10 Essential (primary) hypertension: Secondary | ICD-10-CM

## 2013-03-01 ENCOUNTER — Ambulatory Visit (INDEPENDENT_AMBULATORY_CARE_PROVIDER_SITE_OTHER): Payer: Medicare Other | Admitting: Ophthalmology

## 2013-03-01 DIAGNOSIS — H43819 Vitreous degeneration, unspecified eye: Secondary | ICD-10-CM

## 2013-03-01 DIAGNOSIS — H35039 Hypertensive retinopathy, unspecified eye: Secondary | ICD-10-CM

## 2013-03-01 DIAGNOSIS — I10 Essential (primary) hypertension: Secondary | ICD-10-CM

## 2013-03-01 DIAGNOSIS — H3509 Other intraretinal microvascular abnormalities: Secondary | ICD-10-CM

## 2013-03-01 DIAGNOSIS — H2510 Age-related nuclear cataract, unspecified eye: Secondary | ICD-10-CM

## 2014-03-23 ENCOUNTER — Ambulatory Visit (INDEPENDENT_AMBULATORY_CARE_PROVIDER_SITE_OTHER): Payer: Medicare Other | Admitting: Ophthalmology

## 2014-03-23 DIAGNOSIS — H35033 Hypertensive retinopathy, bilateral: Secondary | ICD-10-CM

## 2014-03-23 DIAGNOSIS — H43811 Vitreous degeneration, right eye: Secondary | ICD-10-CM

## 2014-03-23 DIAGNOSIS — I10 Essential (primary) hypertension: Secondary | ICD-10-CM

## 2014-03-23 DIAGNOSIS — E11329 Type 2 diabetes mellitus with mild nonproliferative diabetic retinopathy without macular edema: Secondary | ICD-10-CM

## 2014-03-23 DIAGNOSIS — H2513 Age-related nuclear cataract, bilateral: Secondary | ICD-10-CM

## 2014-03-23 DIAGNOSIS — E11319 Type 2 diabetes mellitus with unspecified diabetic retinopathy without macular edema: Secondary | ICD-10-CM

## 2015-03-23 ENCOUNTER — Encounter (INDEPENDENT_AMBULATORY_CARE_PROVIDER_SITE_OTHER): Payer: Self-pay | Admitting: Internal Medicine

## 2015-03-23 ENCOUNTER — Ambulatory Visit (INDEPENDENT_AMBULATORY_CARE_PROVIDER_SITE_OTHER): Payer: Medicare Other | Admitting: Internal Medicine

## 2015-03-23 VITALS — BP 130/72 | HR 72 | Temp 98.4°F | Ht 64.0 in | Wt 193.0 lb

## 2015-03-23 DIAGNOSIS — K8581 Other acute pancreatitis with uninfected necrosis: Secondary | ICD-10-CM

## 2015-03-23 DIAGNOSIS — K859 Acute pancreatitis without necrosis or infection, unspecified: Secondary | ICD-10-CM | POA: Insufficient documentation

## 2015-03-23 LAB — CBC WITH DIFFERENTIAL/PLATELET
Basophils Absolute: 0.1 10*3/uL (ref 0.0–0.1)
Basophils Relative: 1 % (ref 0–1)
EOS ABS: 0.2 10*3/uL (ref 0.0–0.7)
EOS PCT: 4 % (ref 0–5)
HEMATOCRIT: 38.1 % (ref 36.0–46.0)
Hemoglobin: 13.4 g/dL (ref 12.0–15.0)
LYMPHS ABS: 1.7 10*3/uL (ref 0.7–4.0)
LYMPHS PCT: 30 % (ref 12–46)
MCH: 29 pg (ref 26.0–34.0)
MCHC: 35.2 g/dL (ref 30.0–36.0)
MCV: 82.5 fL (ref 78.0–100.0)
MONO ABS: 0.6 10*3/uL (ref 0.1–1.0)
MPV: 9.9 fL (ref 8.6–12.4)
Monocytes Relative: 11 % (ref 3–12)
Neutro Abs: 3 10*3/uL (ref 1.7–7.7)
Neutrophils Relative %: 54 % (ref 43–77)
PLATELETS: 277 10*3/uL (ref 150–400)
RBC: 4.62 MIL/uL (ref 3.87–5.11)
RDW: 14 % (ref 11.5–15.5)
WBC: 5.5 10*3/uL (ref 4.0–10.5)

## 2015-03-23 LAB — LIPASE: Lipase: 275 U/L — ABNORMAL HIGH (ref 7–60)

## 2015-03-23 LAB — AMYLASE: Amylase: 108 U/L — ABNORMAL HIGH (ref 0–105)

## 2015-03-23 NOTE — Progress Notes (Signed)
   Subjective:    Patient ID: Stacy Hinton, female    DOB: 1945-10-25, 69 y.o.   MRN: [id number]  HPI Referred to our office by Dr. Woody Seller for pancreatitis. Recent admission to [facility] lat week. Admited for elevated lipase and amylase and nausea. She had epigastric pain which she describes as severe. She was admitted overnight. She states she feels much better now. She says she is 95% if not getter. Appetite is good. She tells me when she was in pain, she hurt after she ate. There is no pain now with eating.  She denies any acid reflux. No abdominal pain. She has a BM one a day. No melena or BRRB. She underwent a CT which revealed suspicious of mild edema surrounding the pancreatic head and uncinate process. Given the clinical hx, suspicious for uncomplicated pancreatitis. Advanced atherosclerosis. Small hiatal hernia.  Her last colonoscopy was at least 5 yrs ago by Dr Owens Shark in [place], [place].   03/15/2016 Amylase 67, Lipase 71. 03/15/2015 Total bili 0.3, AST 14.2, ALP 110, Total protein 7.3, Albumin 4.0, Amylase 74, Lipase 77.  Review of Systems Past Medical History  Diagnosis Date  . Hypertension   . High cholesterol   . Type II diabetes mellitus ([place])   . Pancreatitis     Past Surgical History  Procedure Laterality Date  . Abdominal hysterectomy  1986  . Pars plana vitrectomy  11/19/11    left eye  . Thyroglossal duct cyst  ~ 1972  . Shoulder open rotator cuff repair  ~ 1993    left  . Pars plana vitrectomy  11/19/2011    Procedure: PARS PLANA VITRECTOMY WITH 25 GAUGE;  Surgeon: Hayden Pedro, MD;  Location: [facility];  Service: Ophthalmology;  Laterality: Left;  Vitreous Hemorrhage; Head Scope Laser  . Gas/fluid exchange  11/19/2011    Procedure: GAS/FLUID EXCHANGE;  Surgeon: Hayden Pedro, MD;  Location: [facility];  Service: Ophthalmology;  Laterality: Left;    No Known Allergies  Current Outpatient Prescriptions on File Prior to Visit  Medication Sig Dispense Refill  . acetaminophen  (TYLENOL) 500 MG tablet Take 500 mg by mouth daily.    Marland Kitchen lisinopril (PRINIVIL,ZESTRIL) 5 MG tablet Take 5 mg by mouth daily.    . Multiple Vitamins-Minerals (MULTIVITAMINS THER. W/MINERALS) TABS Take 1 tablet by mouth daily.     No current facility-administered medications on file prior to visit.        Objective:   Physical Exam Blood pressure 130/72, pulse 72, temperature 98.4 F (36.9 C), height 5\' 4"  (1.626 m), weight 193 lb (87.544 kg). Alert and oriented. Skin warm and dry. Oral mucosa is moist.   . Sclera anicteric, conjunctivae is pink. Thyroid not enlarged. No cervical lymphadenopathy. Lungs clear. Heart regular rate and rhythm.  Abdomen is soft. Bowel sounds are positive. No hepatomegaly. No abdominal masses felt. No tenderness.  No edema to lower extremities. Patient is alert and oriented.        Assessment & Plan:  Pancreatitis.  She is 95% better now. Will get an amylase and Lipase on her today. Will get CD of the CT from [facility]. Needs to stop smoking. Low fat diet.  OV in 3 months.

## 2015-03-23 NOTE — Patient Instructions (Signed)
Labs today. OV 3 months.  

## 2015-03-24 ENCOUNTER — Other Ambulatory Visit (INDEPENDENT_AMBULATORY_CARE_PROVIDER_SITE_OTHER): Payer: Self-pay | Admitting: Internal Medicine

## 2015-03-24 DIAGNOSIS — R748 Abnormal levels of other serum enzymes: Secondary | ICD-10-CM

## 2015-03-24 DIAGNOSIS — K8500 Idiopathic acute pancreatitis without necrosis or infection: Secondary | ICD-10-CM

## 2015-03-30 ENCOUNTER — Ambulatory Visit (HOSPITAL_COMMUNITY)
Admission: RE | Admit: 2015-03-30 | Discharge: 2015-03-30 | Disposition: A | Discharge status: Home / Self Care | Payer: Medicare Other | Source: Ambulatory Visit | Attending: Internal Medicine | Admitting: Internal Medicine

## 2015-03-30 ENCOUNTER — Telehealth (INDEPENDENT_AMBULATORY_CARE_PROVIDER_SITE_OTHER): Payer: Self-pay | Admitting: Internal Medicine

## 2015-03-30 ENCOUNTER — Encounter (INDEPENDENT_AMBULATORY_CARE_PROVIDER_SITE_OTHER): Payer: Self-pay

## 2015-03-30 DIAGNOSIS — K8500 Idiopathic acute pancreatitis without necrosis or infection: Secondary | ICD-10-CM | POA: Diagnosis not present

## 2015-03-30 DIAGNOSIS — R1013 Epigastric pain: Secondary | ICD-10-CM

## 2015-03-30 DIAGNOSIS — R748 Abnormal levels of other serum enzymes: Secondary | ICD-10-CM | POA: Diagnosis not present

## 2015-03-30 MED ORDER — DICYCLOMINE HCL 10 MG PO CAPS: 10.0000 mg | ORAL_CAPSULE | Freq: Three times a day (TID) | ORAL | Status: DC

## 2015-03-30 NOTE — Telephone Encounter (Signed)
Butch Penny, OV in 3 weeks;

## 2015-03-31 NOTE — Telephone Encounter (Signed)
LM for patient to return the call on 04/03/15.

## 2015-04-04 NOTE — Telephone Encounter (Signed)
Apt has been scheduled for 04/17/15 with Deberah Castle, NP.

## 2015-04-17 ENCOUNTER — Ambulatory Visit (INDEPENDENT_AMBULATORY_CARE_PROVIDER_SITE_OTHER): Payer: BLUE CROSS/BLUE SHIELD | Admitting: Internal Medicine

## 2015-04-17 ENCOUNTER — Encounter (INDEPENDENT_AMBULATORY_CARE_PROVIDER_SITE_OTHER): Payer: Self-pay | Admitting: Internal Medicine

## 2015-04-17 VITALS — BP 132/70 | HR 64 | Temp 98.3°F | Ht 64.0 in | Wt 192.3 lb

## 2015-04-17 DIAGNOSIS — K8500 Idiopathic acute pancreatitis without necrosis or infection: Secondary | ICD-10-CM

## 2015-04-17 LAB — HEPATIC FUNCTION PANEL
ALBUMIN: 4.3 g/dL (ref 3.6–5.1)
ALK PHOS: 130 U/L (ref 33–130)
ALT: 23 U/L (ref 6–29)
AST: 17 U/L (ref 10–35)
Bilirubin, Direct: <0.1 mg/dL (ref <=0.2)
TOTAL PROTEIN: 7 g/dL (ref 6.1–8.1)
Total Bilirubin: 0.3 mg/dL (ref 0.2–1.2)

## 2015-04-17 LAB — CBC WITH DIFFERENTIAL/PLATELET
BASOS ABS: 0.1 10*3/uL (ref 0.0–0.1)
Basophils Relative: 1 % (ref 0–1)
EOS ABS: 0.2 10*3/uL (ref 0.0–0.7)
Eosinophils Relative: 4 % (ref 0–5)
HEMATOCRIT: 38 % (ref 36.0–46.0)
HEMOGLOBIN: 13.2 g/dL (ref 12.0–15.0)
LYMPHS ABS: 1.7 10*3/uL (ref 0.7–4.0)
LYMPHS PCT: 30 % (ref 12–46)
MCH: 28.9 pg (ref 26.0–34.0)
MCHC: 34.7 g/dL (ref 30.0–36.0)
MCV: 83.2 fL (ref 78.0–100.0)
MONOS PCT: 10 % (ref 3–12)
MPV: 10.4 fL (ref 8.6–12.4)
Monocytes Absolute: 0.6 10*3/uL (ref 0.1–1.0)
NEUTROS ABS: 3.2 10*3/uL (ref 1.7–7.7)
NEUTROS PCT: 55 % (ref 43–77)
PLATELETS: 250 10*3/uL (ref 150–400)
RBC: 4.57 MIL/uL (ref 3.87–5.11)
RDW: 14.2 % (ref 11.5–15.5)
WBC: 5.8 10*3/uL (ref 4.0–10.5)

## 2015-04-17 LAB — AMYLASE: Amylase: 68 U/L (ref 0–105)

## 2015-04-17 LAB — LIPASE: Lipase: 87 U/L — ABNORMAL HIGH (ref 7–60)

## 2015-04-17 NOTE — Patient Instructions (Signed)
Labs today. OV in 3 months.  

## 2015-04-17 NOTE — Progress Notes (Signed)
Subjective:    Patient ID: Stacy Hinton, female    DOB: 1945-08-15, 69 y.o.   MRN: [id number]  [facility] today for f/u of her pancreatitis. Admitted to [facility] in November. Her amylase and lipase were elevated. She had epigastric pain.  She wa admitted overnight.  She underwent a CT AT [facility] which revealed suspicious of mild edema surrounding the pancreatic head and uncinate process. Given the clinical hx, suspicious for uncomplicated pancreatitis. Advanced atherosclerosis. Small hiatal hernia.  Seen in office 03/23/2015. After second CT obtained I advised her if she was having pain to go to the ED.   [place]  on 03/30/2015 revealed: CBD  4.1 IMPRESSION: 1. Edematous pancreatic head and body consistent with pancreatitis. The tail is obscured. A small pseudocyst adjacent to the pancreatic head is suspected. 2. Normal appearance of the gallbladder and liver and spleen and 03/23/2015 Amylase 108, Lipase 275 03/16/2015 Amylase 67, Lipase 71. 03/15/2015 Total bili 0.3, AST 14.2, ALP 110, Total protein 7.3, Albumin 4.0, Amylase 74, Lipase 77. After second CT, I advised her to go on a clear liquid diet. She advanced her diet after about a weeks.  She tells me she is doing great. She tells me she is watching what she eats. She is avoiding fried food. She is not having any abdominal pain x 2 weeks. Appetite is good. No weight loss.  She is not having any acid reflux.  She says she 100% better.  Has been taking Dicyclomine and Omeprazole once a day.   Review of Systems Past Medical History  Diagnosis Date  . Hypertension   . High cholesterol   . Type II diabetes mellitus ([place])   . Pancreatitis     Past Surgical History  Procedure Laterality Date  . Abdominal hysterectomy  1986  . Pars plana vitrectomy  11/19/11    left eye  . Thyroglossal duct cyst  ~ 1972  . Shoulder open rotator cuff repair  ~ 1993    left  . Pars plana vitrectomy  11/19/2011    Procedure: PARS PLANA VITRECTOMY WITH 25 GAUGE;   Surgeon: Hayden Pedro, MD;  Location: [facility];  Service: Ophthalmology;  Laterality: Left;  Vitreous Hemorrhage; Head Scope Laser  . Gas/fluid exchange  11/19/2011    Procedure: GAS/FLUID EXCHANGE;  Surgeon: Hayden Pedro, MD;  Location: [facility];  Service: Ophthalmology;  Laterality: Left;    No Known Allergies  Current Outpatient Prescriptions on File Prior to Visit  Medication Sig Dispense Refill  . acetaminophen (TYLENOL) 500 MG tablet Take 500 mg by mouth daily.    Marland Kitchen dicyclomine (BENTYL) 10 MG capsule Take 1 capsule (10 mg total) by mouth 3 (three) times daily before meals. 90 capsule 3  . glimepiride (AMARYL) 1 MG tablet Take 1 mg by mouth daily with breakfast.    . lisinopril (PRINIVIL,ZESTRIL) 5 MG tablet Take 5 mg by mouth daily.    Marland Kitchen lovastatin (MEVACOR) 20 MG tablet Take 20 mg by mouth at bedtime.    . Multiple Vitamins-Minerals (MULTIVITAMINS THER. W/MINERALS) TABS Take 1 tablet by mouth daily.    Marland Kitchen omeprazole (PRILOSEC) 20 MG capsule Take 20 mg by mouth 2 (two) times daily before a meal.     No current facility-administered medications on file prior to visit.        Objective:   Physical Exam Blood pressure 132/70, pulse 64, temperature 98.3 F (36.8 C), height 5\' 4"  (1.626 m), weight 192 lb 4.8 oz (87.227 kg). Alert  and oriented. Skin warm and dry. Oral mucosa is moist.   . Sclera anicteric, conjunctivae is pink. Thyroid not enlarged. No cervical lymphadenopathy. Lungs clear. Heart regular rate and rhythm.  Abdomen is soft. Bowel sounds are positive. No hepatomegaly. No abdominal masses felt. No tenderness.  No edema to lower extremities.         Assessment & Plan:  Pancreatitis. She feels 100% better. Will get a CBC, Hepatic function ,amylase,lipase. OV in 3 months.

## 2015-04-26 ENCOUNTER — Ambulatory Visit (INDEPENDENT_AMBULATORY_CARE_PROVIDER_SITE_OTHER): Payer: Medicare Other | Admitting: Ophthalmology

## 2015-05-25 ENCOUNTER — Ambulatory Visit (INDEPENDENT_AMBULATORY_CARE_PROVIDER_SITE_OTHER): Payer: Medicare Other | Admitting: Ophthalmology

## 2015-06-01 DIAGNOSIS — Z6833 Body mass index (BMI) 33.0-33.9, adult: Secondary | ICD-10-CM | POA: Diagnosis not present

## 2015-06-01 DIAGNOSIS — E1165 Type 2 diabetes mellitus with hyperglycemia: Secondary | ICD-10-CM | POA: Diagnosis not present

## 2015-06-01 DIAGNOSIS — J069 Acute upper respiratory infection, unspecified: Secondary | ICD-10-CM | POA: Diagnosis not present

## 2015-06-01 DIAGNOSIS — I10 Essential (primary) hypertension: Secondary | ICD-10-CM | POA: Diagnosis not present

## 2015-06-26 ENCOUNTER — Ambulatory Visit (INDEPENDENT_AMBULATORY_CARE_PROVIDER_SITE_OTHER): Payer: Medicare Other | Admitting: Internal Medicine

## 2015-06-28 ENCOUNTER — Ambulatory Visit (INDEPENDENT_AMBULATORY_CARE_PROVIDER_SITE_OTHER): Payer: Medicare Other | Admitting: Ophthalmology

## 2015-06-29 ENCOUNTER — Ambulatory Visit (INDEPENDENT_AMBULATORY_CARE_PROVIDER_SITE_OTHER): Payer: BLUE CROSS/BLUE SHIELD | Admitting: Ophthalmology

## 2015-06-29 DIAGNOSIS — H35342 Macular cyst, hole, or pseudohole, left eye: Secondary | ICD-10-CM

## 2015-06-29 DIAGNOSIS — I10 Essential (primary) hypertension: Secondary | ICD-10-CM

## 2015-06-29 DIAGNOSIS — H3509 Other intraretinal microvascular abnormalities: Secondary | ICD-10-CM | POA: Diagnosis not present

## 2015-06-29 DIAGNOSIS — H2513 Age-related nuclear cataract, bilateral: Secondary | ICD-10-CM | POA: Diagnosis not present

## 2015-06-29 DIAGNOSIS — H35033 Hypertensive retinopathy, bilateral: Secondary | ICD-10-CM | POA: Diagnosis not present

## 2015-06-29 DIAGNOSIS — H43811 Vitreous degeneration, right eye: Secondary | ICD-10-CM | POA: Diagnosis not present

## 2015-07-17 ENCOUNTER — Ambulatory Visit (INDEPENDENT_AMBULATORY_CARE_PROVIDER_SITE_OTHER): Payer: BLUE CROSS/BLUE SHIELD | Admitting: Internal Medicine

## 2015-09-04 DIAGNOSIS — I10 Essential (primary) hypertension: Secondary | ICD-10-CM | POA: Diagnosis not present

## 2015-09-04 DIAGNOSIS — E1165 Type 2 diabetes mellitus with hyperglycemia: Secondary | ICD-10-CM | POA: Diagnosis not present

## 2015-09-04 DIAGNOSIS — R21 Rash and other nonspecific skin eruption: Secondary | ICD-10-CM | POA: Diagnosis not present

## 2015-12-07 DIAGNOSIS — I10 Essential (primary) hypertension: Secondary | ICD-10-CM | POA: Diagnosis not present

## 2015-12-07 DIAGNOSIS — M25572 Pain in left ankle and joints of left foot: Secondary | ICD-10-CM | POA: Diagnosis not present

## 2015-12-07 DIAGNOSIS — E1165 Type 2 diabetes mellitus with hyperglycemia: Secondary | ICD-10-CM | POA: Diagnosis not present

## 2015-12-07 DIAGNOSIS — Z720 Tobacco use: Secondary | ICD-10-CM | POA: Diagnosis not present

## 2015-12-07 DIAGNOSIS — F17200 Nicotine dependence, unspecified, uncomplicated: Secondary | ICD-10-CM | POA: Diagnosis not present

## 2016-03-14 DIAGNOSIS — Z6834 Body mass index (BMI) 34.0-34.9, adult: Secondary | ICD-10-CM | POA: Diagnosis not present

## 2016-03-14 DIAGNOSIS — Z713 Dietary counseling and surveillance: Secondary | ICD-10-CM | POA: Diagnosis not present

## 2016-03-14 DIAGNOSIS — Z299 Encounter for prophylactic measures, unspecified: Secondary | ICD-10-CM | POA: Diagnosis not present

## 2016-03-14 DIAGNOSIS — Z720 Tobacco use: Secondary | ICD-10-CM | POA: Diagnosis not present

## 2016-03-14 DIAGNOSIS — F17200 Nicotine dependence, unspecified, uncomplicated: Secondary | ICD-10-CM | POA: Diagnosis not present

## 2016-03-14 DIAGNOSIS — E119 Type 2 diabetes mellitus without complications: Secondary | ICD-10-CM | POA: Diagnosis not present

## 2016-05-12 DIAGNOSIS — E785 Hyperlipidemia, unspecified: Secondary | ICD-10-CM | POA: Diagnosis present

## 2016-05-12 DIAGNOSIS — I6523 Occlusion and stenosis of bilateral carotid arteries: Secondary | ICD-10-CM | POA: Diagnosis present

## 2016-05-12 DIAGNOSIS — I638 Other cerebral infarction: Secondary | ICD-10-CM | POA: Diagnosis not present

## 2016-05-12 DIAGNOSIS — I6781 Acute cerebrovascular insufficiency: Secondary | ICD-10-CM | POA: Diagnosis not present

## 2016-05-12 DIAGNOSIS — I639 Cerebral infarction, unspecified: Secondary | ICD-10-CM | POA: Diagnosis not present

## 2016-05-12 DIAGNOSIS — E119 Type 2 diabetes mellitus without complications: Secondary | ICD-10-CM | POA: Diagnosis present

## 2016-05-12 DIAGNOSIS — F17210 Nicotine dependence, cigarettes, uncomplicated: Secondary | ICD-10-CM | POA: Diagnosis present

## 2016-05-12 DIAGNOSIS — J849 Interstitial pulmonary disease, unspecified: Secondary | ICD-10-CM | POA: Diagnosis not present

## 2016-05-12 DIAGNOSIS — R531 Weakness: Secondary | ICD-10-CM | POA: Diagnosis not present

## 2016-05-12 DIAGNOSIS — R29898 Other symptoms and signs involving the musculoskeletal system: Secondary | ICD-10-CM | POA: Diagnosis not present

## 2016-05-12 DIAGNOSIS — G8191 Hemiplegia, unspecified affecting right dominant side: Secondary | ICD-10-CM | POA: Diagnosis not present

## 2016-05-12 DIAGNOSIS — E1165 Type 2 diabetes mellitus with hyperglycemia: Secondary | ICD-10-CM | POA: Diagnosis not present

## 2016-05-12 DIAGNOSIS — I10 Essential (primary) hypertension: Secondary | ICD-10-CM | POA: Diagnosis not present

## 2016-05-12 DIAGNOSIS — I498 Other specified cardiac arrhythmias: Secondary | ICD-10-CM | POA: Diagnosis not present

## 2016-05-12 DIAGNOSIS — I610 Nontraumatic intracerebral hemorrhage in hemisphere, subcortical: Secondary | ICD-10-CM | POA: Diagnosis not present

## 2016-05-12 DIAGNOSIS — I161 Hypertensive emergency: Secondary | ICD-10-CM | POA: Diagnosis not present

## 2016-05-12 DIAGNOSIS — R404 Transient alteration of awareness: Secondary | ICD-10-CM | POA: Diagnosis not present

## 2016-05-12 DIAGNOSIS — Z66 Do not resuscitate: Secondary | ICD-10-CM | POA: Diagnosis not present

## 2016-05-12 DIAGNOSIS — G8190 Hemiplegia, unspecified affecting unspecified side: Secondary | ICD-10-CM | POA: Diagnosis not present

## 2016-05-13 DIAGNOSIS — I10 Essential (primary) hypertension: Secondary | ICD-10-CM | POA: Diagnosis not present

## 2016-05-13 DIAGNOSIS — I638 Other cerebral infarction: Secondary | ICD-10-CM | POA: Diagnosis not present

## 2016-05-13 DIAGNOSIS — I498 Other specified cardiac arrhythmias: Secondary | ICD-10-CM | POA: Diagnosis not present

## 2016-05-15 DIAGNOSIS — Z720 Tobacco use: Secondary | ICD-10-CM | POA: Diagnosis not present

## 2016-05-15 DIAGNOSIS — Z9181 History of falling: Secondary | ICD-10-CM | POA: Diagnosis not present

## 2016-05-15 DIAGNOSIS — G8191 Hemiplegia, unspecified affecting right dominant side: Secondary | ICD-10-CM | POA: Diagnosis not present

## 2016-05-15 DIAGNOSIS — E785 Hyperlipidemia, unspecified: Secondary | ICD-10-CM | POA: Diagnosis present

## 2016-05-15 DIAGNOSIS — Z90710 Acquired absence of both cervix and uterus: Secondary | ICD-10-CM | POA: Diagnosis not present

## 2016-05-15 DIAGNOSIS — Z66 Do not resuscitate: Secondary | ICD-10-CM | POA: Diagnosis present

## 2016-05-15 DIAGNOSIS — I639 Cerebral infarction, unspecified: Secondary | ICD-10-CM | POA: Diagnosis not present

## 2016-05-15 DIAGNOSIS — I10 Essential (primary) hypertension: Secondary | ICD-10-CM | POA: Diagnosis present

## 2016-05-15 DIAGNOSIS — I6529 Occlusion and stenosis of unspecified carotid artery: Secondary | ICD-10-CM | POA: Diagnosis present

## 2016-05-15 DIAGNOSIS — I69951 Hemiplegia and hemiparesis following unspecified cerebrovascular disease affecting right dominant side: Secondary | ICD-10-CM | POA: Diagnosis not present

## 2016-05-15 DIAGNOSIS — Z9889 Other specified postprocedural states: Secondary | ICD-10-CM | POA: Diagnosis not present

## 2016-05-15 DIAGNOSIS — R2689 Other abnormalities of gait and mobility: Secondary | ICD-10-CM | POA: Diagnosis present

## 2016-05-15 DIAGNOSIS — K5900 Constipation, unspecified: Secondary | ICD-10-CM | POA: Diagnosis present

## 2016-05-15 DIAGNOSIS — E119 Type 2 diabetes mellitus without complications: Secondary | ICD-10-CM | POA: Diagnosis not present

## 2016-05-31 DIAGNOSIS — I6522 Occlusion and stenosis of left carotid artery: Secondary | ICD-10-CM | POA: Diagnosis not present

## 2016-05-31 DIAGNOSIS — I10 Essential (primary) hypertension: Secondary | ICD-10-CM | POA: Diagnosis not present

## 2016-05-31 DIAGNOSIS — I639 Cerebral infarction, unspecified: Secondary | ICD-10-CM | POA: Diagnosis not present

## 2016-05-31 DIAGNOSIS — E785 Hyperlipidemia, unspecified: Secondary | ICD-10-CM | POA: Diagnosis not present

## 2016-06-10 ENCOUNTER — Encounter: Payer: Self-pay | Admitting: Vascular Surgery

## 2016-06-10 DIAGNOSIS — E119 Type 2 diabetes mellitus without complications: Secondary | ICD-10-CM | POA: Diagnosis not present

## 2016-06-10 DIAGNOSIS — E7800 Pure hypercholesterolemia, unspecified: Secondary | ICD-10-CM | POA: Diagnosis not present

## 2016-06-10 DIAGNOSIS — I639 Cerebral infarction, unspecified: Secondary | ICD-10-CM | POA: Diagnosis not present

## 2016-06-10 DIAGNOSIS — Z299 Encounter for prophylactic measures, unspecified: Secondary | ICD-10-CM | POA: Diagnosis not present

## 2016-06-11 ENCOUNTER — Other Ambulatory Visit: Payer: Self-pay | Admitting: *Deleted

## 2016-06-11 DIAGNOSIS — I639 Cerebral infarction, unspecified: Secondary | ICD-10-CM

## 2016-06-12 ENCOUNTER — Encounter: Payer: Self-pay | Admitting: Vascular Surgery

## 2016-06-12 ENCOUNTER — Ambulatory Visit (INDEPENDENT_AMBULATORY_CARE_PROVIDER_SITE_OTHER): Payer: BLUE CROSS/BLUE SHIELD | Admitting: Vascular Surgery

## 2016-06-12 ENCOUNTER — Ambulatory Visit (HOSPITAL_COMMUNITY)
Admission: RE | Admit: 2016-06-12 | Discharge: 2016-06-12 | Disposition: A | Discharge status: Home / Self Care | Payer: BLUE CROSS/BLUE SHIELD | Source: Ambulatory Visit | Attending: Vascular Surgery | Admitting: Vascular Surgery

## 2016-06-12 VITALS — BP 141/77 | HR 88 | Temp 97.3°F | Resp 16 | Ht 64.0 in | Wt 194.0 lb

## 2016-06-12 DIAGNOSIS — I6521 Occlusion and stenosis of right carotid artery: Secondary | ICD-10-CM | POA: Diagnosis not present

## 2016-06-12 DIAGNOSIS — I639 Cerebral infarction, unspecified: Secondary | ICD-10-CM | POA: Diagnosis not present

## 2016-06-12 DIAGNOSIS — Z1389 Encounter for screening for other disorder: Secondary | ICD-10-CM | POA: Insufficient documentation

## 2016-06-12 LAB — VAS US CAROTID
LCCADSYS: 115 cm/s
LCCAPDIAS: 17 cm/s
LEFT ECA DIAS: 19 cm/s
LICADDIAS: -32 cm/s
Left CCA dist dias: 30 cm/s
Left CCA prox sys: 89 cm/s
Left ICA dist sys: -77 cm/s
Left ICA prox dias: -22 cm/s
Left ICA prox sys: -109 cm/s
RCCADSYS: -77 cm/s
RCCAPDIAS: -17 cm/s
RIGHT CCA MID DIAS: 20 cm/s
RIGHT ECA DIAS: -10 cm/s
Right CCA prox sys: -100 cm/s

## 2016-06-12 NOTE — Progress Notes (Signed)
Patient name: Stacy Hinton MRN: [id number] DOB: Apr 06, 1946 Sex: female  REASON FOR CONSULT: Carotid disease. Referred by Dr. Woody Seller.  HPI: Stacy Hinton is a 71 y.o. female, who was hospitalized recently with a left brain stroke. I have reviewed the records from [facility]. The patient had an acute CVA involving the left middle cerebral artery. CT scan on 05/12/2016 showed no acute findings. The patient had presented with right-sided weakness. Duplex during that admission showed a 50-69% stenosis in the right carotid artery and a less than 50% left carotid artery stenosis. The patient had been followed with carotid disease by Dr. Farris Has. I have also reviewed the labs from that admission. It appears that her renal function is normal. Apparently her blood pressure was extremely high during this admission and may have potentially been the cause of her stroke.  The patient still has some mild right lower extremely weakness and is walking with a Tiano but has made excellent progress. She's had no previous history of stroke, TIAs, expressive or receptive aphasia, or amaurosis fugax.  She smokes 1-2 cigarettes a day. She is on aspirin and is on a statin.  Past Medical History:  Diagnosis Date  . High cholesterol   . Hypertension   . Pancreatitis   . Type II diabetes mellitus ([place])     No family history on file.  SOCIAL HISTORY: Social History   Social History  . Marital status: Single    Spouse name: N/A  . Number of children: N/A  . Years of education: N/A   Occupational History  . Not on file.   Social History Main Topics  . Smoking status: Current Every Day Smoker    Years: 10.00    Types: Cigarettes    Last attempt to quit: 07/05/1994  . Smokeless tobacco: Never Used     Comment: smokes abou 2-3 cigarettes a day x 1 yrs ago.   . Alcohol use No  . Drug use: Yes    Types: Marijuana     Comment: 11/19/11 "last marijuana use was 3-4 years ago"  . Sexual activity: No   Other  Topics Concern  . Not on file   Social History Narrative  . No narrative on file    No Known Allergies  Current Outpatient Prescriptions  Medication Sig Dispense Refill  . acetaminophen (TYLENOL) 500 MG tablet Take 500 mg by mouth daily.    Marland Kitchen amLODipine (NORVASC) 5 MG tablet Take 5 mg by mouth daily.    Marland Kitchen aspirin EC 81 MG tablet Take 81 mg by mouth daily.    Marland Kitchen atorvastatin (LIPITOR) 40 MG tablet Take 40 mg by mouth daily.    Marland Kitchen lisinopril (PRINIVIL,ZESTRIL) 10 MG tablet Take 10 mg by mouth daily.    . Multiple Vitamins-Minerals (MULTIVITAMINS THER. W/MINERALS) TABS Take 1 tablet by mouth daily.     No current facility-administered medications for this visit.     REVIEW OF SYSTEMS:  [X]  denotes positive finding, [ ]  denotes negative finding Cardiac  Comments:  Chest pain or chest pressure:    Shortness of breath upon exertion:    Short of breath when lying flat:    Irregular heart rhythm:        Vascular    Pain in calf, thigh, or hip brought on by ambulation:    Pain in feet at night that wakes you up from your sleep:     Blood clot in your veins:    Leg swelling:  Pulmonary    Oxygen at home:    Productive cough:     Wheezing:         Neurologic    Sudden weakness in arms or legs:     Sudden numbness in arms or legs:     Sudden onset of difficulty speaking or slurred speech:    Temporary loss of vision in one eye:     Problems with dizziness:         Gastrointestinal    Blood in stool:     Vomited blood:         Genitourinary    Burning when urinating:     Blood in urine:        Psychiatric    Major depression:         Hematologic    Bleeding problems:    Problems with blood clotting too easily:        Skin    Rashes or ulcers:        Constitutional    Fever or chills:      PHYSICAL EXAM: Vitals:   06/12/16 1306 06/12/16 1309  BP: (!) 150/81 (!) 141/77  Pulse: 88   Resp: 16   Temp: 97.3 F (36.3 C)   TempSrc: Oral   SpO2: 98%     Weight: 194 lb (88 kg)   Height: 5\' 4"  (1.626 m)     GENERAL: The patient is a well-nourished female, in no acute distress. The vital signs are documented above. CARDIAC: There is a regular rate and rhythm.  VASCULAR: I do not detect carotid bruits. She has palpable dorsalis pedis pulses bilaterally. She has no significant lower extremity swelling. PULMONARY: There is good air exchange bilaterally without wheezing or rales. ABDOMEN: Soft and non-tender with normal pitched bowel sounds.  MUSCULOSKELETAL: There are no major deformities or cyanosis. NEUROLOGIC: I do not detect any right upper extremity weakness. She has very mild right lower extremity weakness.  SKIN: There are no ulcers or rashes noted. PSYCHIATRIC: The patient has a normal affect.  DATA:   CAROTID DUPLEX: I have independently interpreted her carotid duplex scan today.  On the left side, which is the side of concern, there is no evidence of carotid stenosis. There is an external carotid artery stenosis.  On the right side, there is a 40-59% carotid stenosis.  Both vertebral arteries are patent with antegrade flow.  MEDICAL ISSUES:  LEFT BRAIN STROKE: This patient had a left brain stroke, but has no evidence of significant left carotid stenosis. The stroke may have potentially been related to a hypertensive episode. I do not see that she had an echo during that admission and that may be a consideration if she did not have an echo. However based on her duplex can I would not recommend carotid endarterectomy as she has no significant stenosis on the left. She has a moderate asymptomatic right carotid stenosis. The only other consideration would be to do a cerebral arteriogram to look for an ulceration although this procedure is associated with a 1% risk of stroke. I do not think that a CT angiogram adequately allows [place] to assess for ulceration. I've encouraged her to try to get off the cigarettes completely. I have ordered a  follow up carotid duplex scan in 6 months and I'll see her back at that time. I've encouraged her to continue taking her aspirin and statin. She knows to call sooner if she has problems.   Deitra [facility] [employer] [facility] [phone]

## 2016-06-13 ENCOUNTER — Encounter: Payer: Self-pay | Admitting: Nurse Practitioner

## 2016-06-14 NOTE — Addendum Note (Signed)
Addended by: Lianne Cure A on: 06/14/2016 05:02 PM   Modules accepted: Orders

## 2016-06-20 DIAGNOSIS — E1165 Type 2 diabetes mellitus with hyperglycemia: Secondary | ICD-10-CM | POA: Diagnosis not present

## 2016-06-20 DIAGNOSIS — I10 Essential (primary) hypertension: Secondary | ICD-10-CM | POA: Diagnosis not present

## 2016-06-20 DIAGNOSIS — I639 Cerebral infarction, unspecified: Secondary | ICD-10-CM | POA: Diagnosis not present

## 2016-06-20 DIAGNOSIS — Z299 Encounter for prophylactic measures, unspecified: Secondary | ICD-10-CM | POA: Diagnosis not present

## 2016-06-25 IMAGING — US US ABDOMEN COMPLETE
1 series · 13 of 25 positions shown · non-contrast
Comparison: Abdominal and pelvic CT scan March 15, 2015

CLINICAL DATA: Idiopathic acute pancreatitis, elevated lipase.

EXAM:
ULTRASOUND ABDOMEN COMPLETE

[Series 1: us abdomen complete · 0.19mm/px · 13 of 143 slices shown]
[im 1/143]
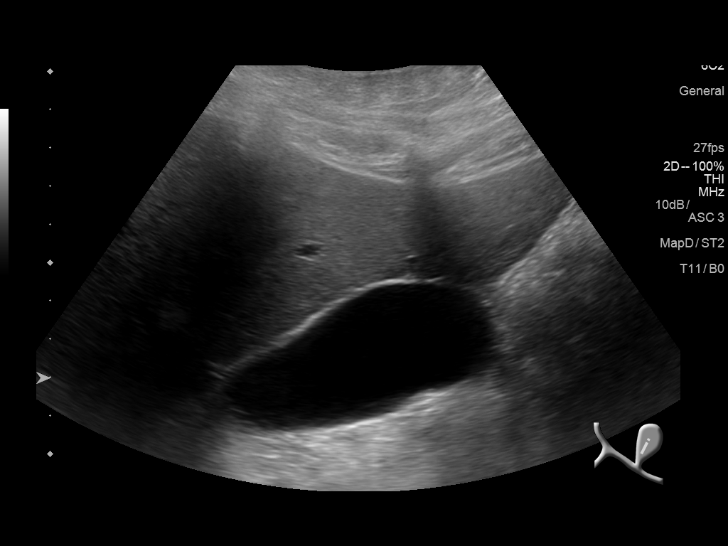
[im 12/143]
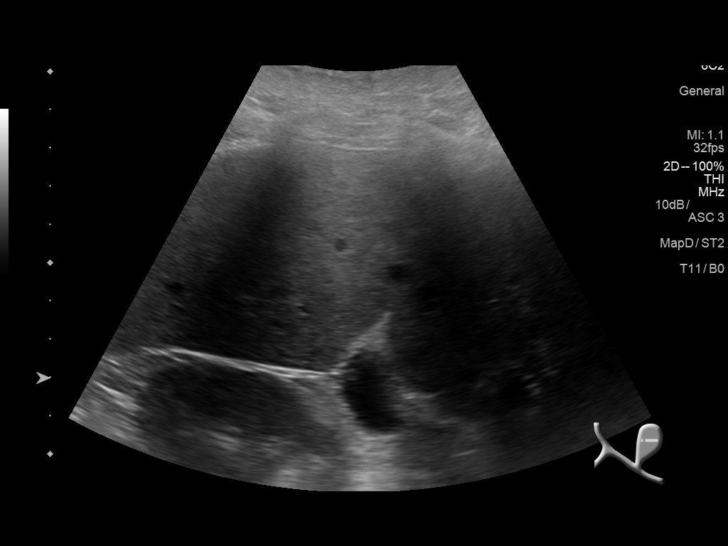
[im 24/143]
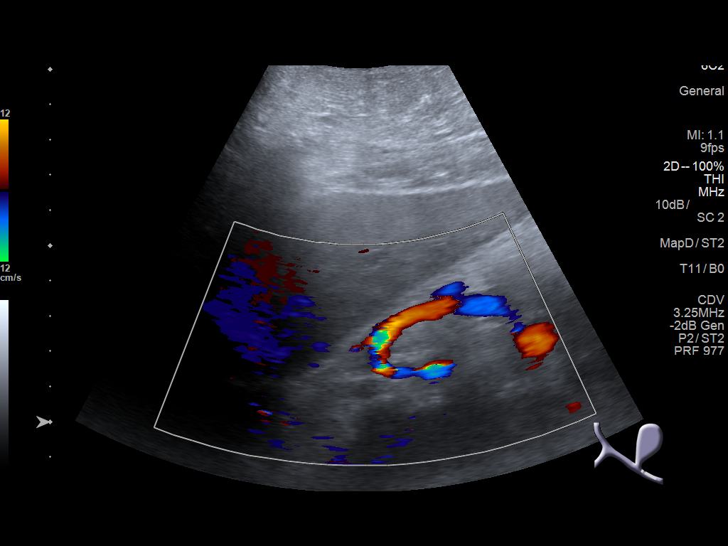
[im 36/143]
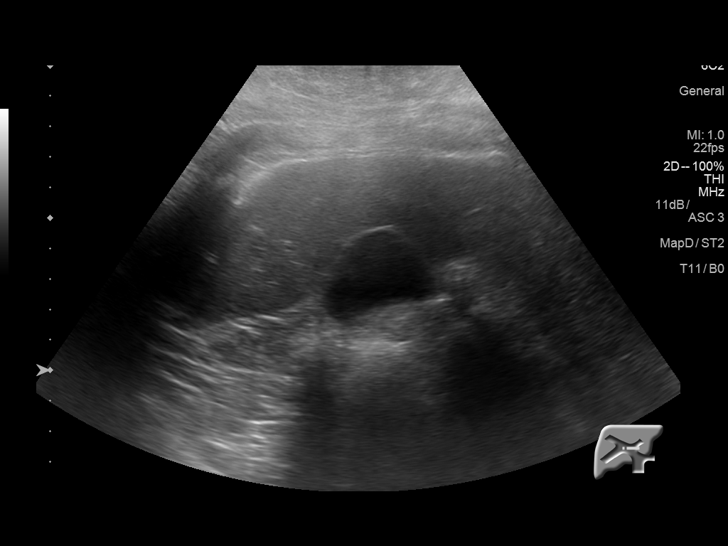
[im 48/143]
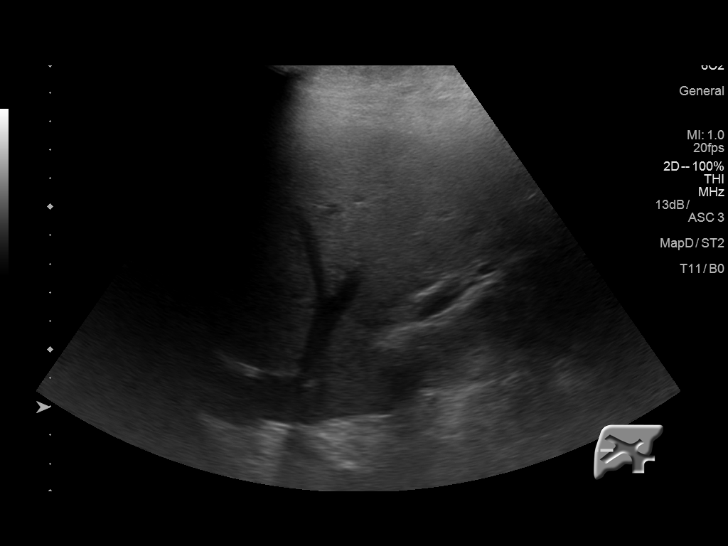
[im 60/143]
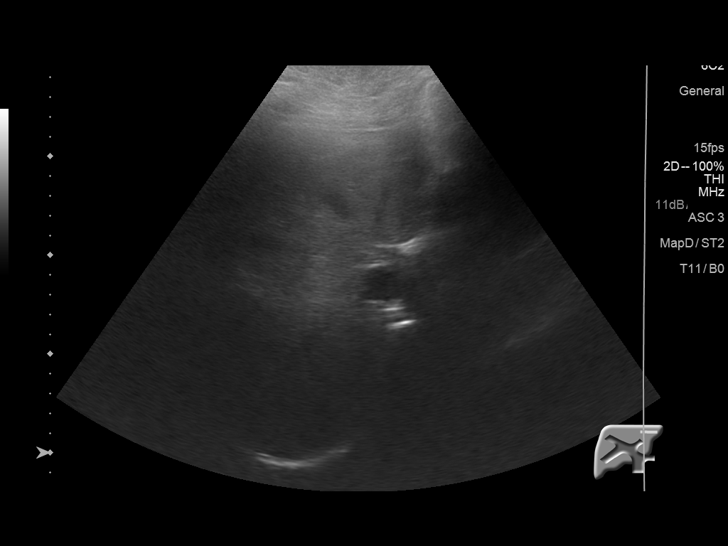
[im 72/143]
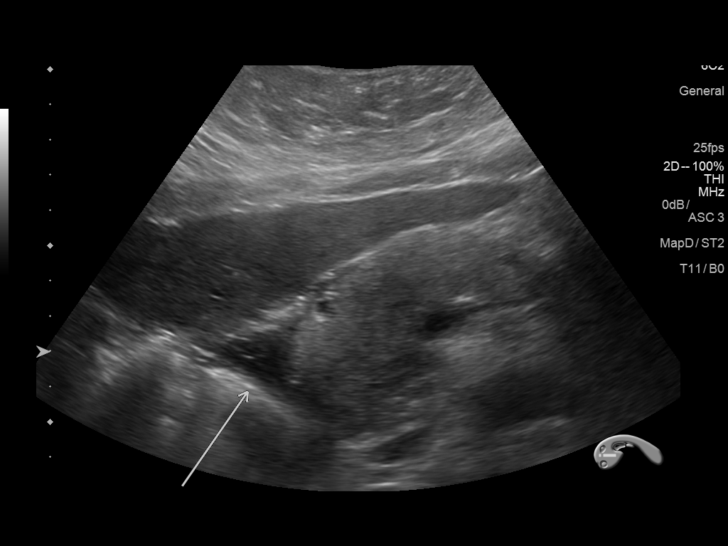
[im 83/143]
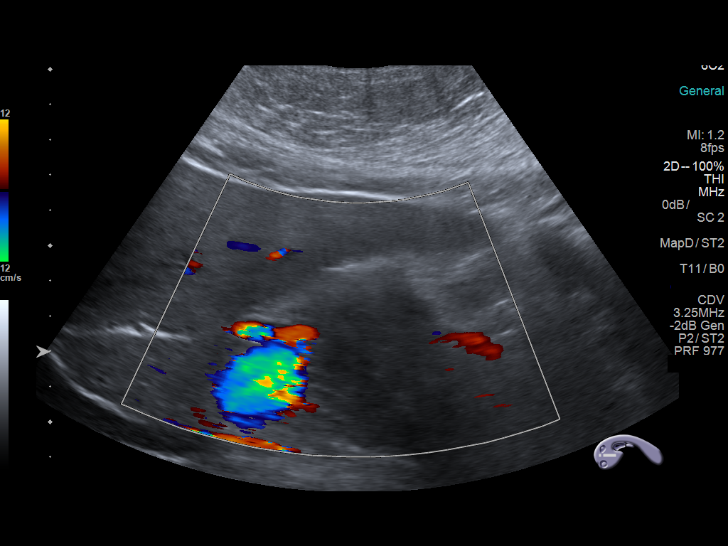
[im 95/143]
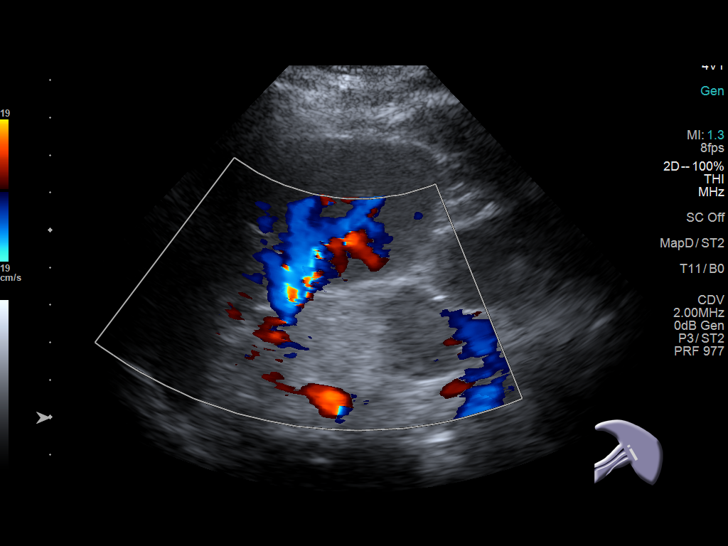
[im 107/143]
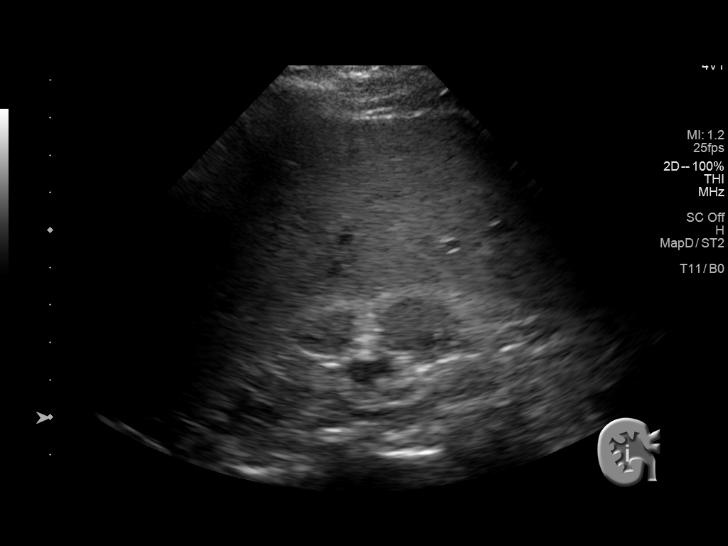
[im 119/143]
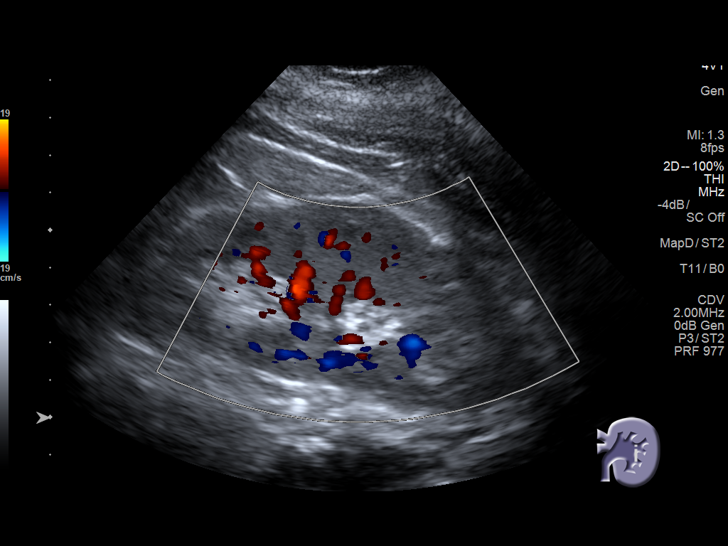
[im 131/143]
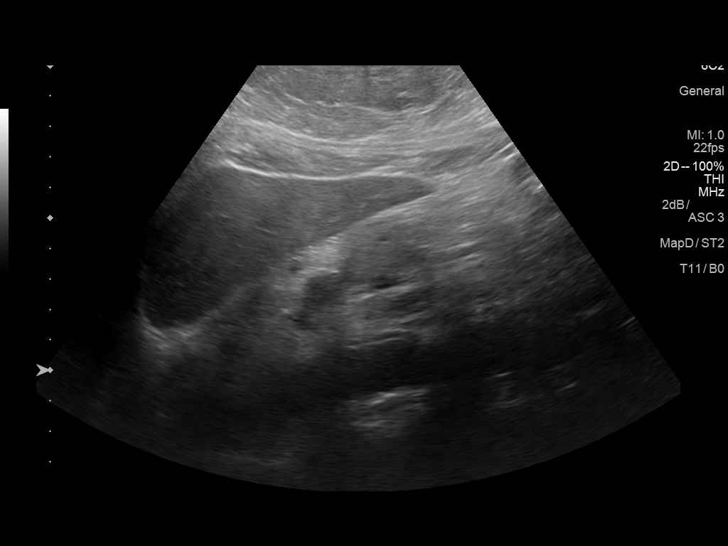
[im 143/143]
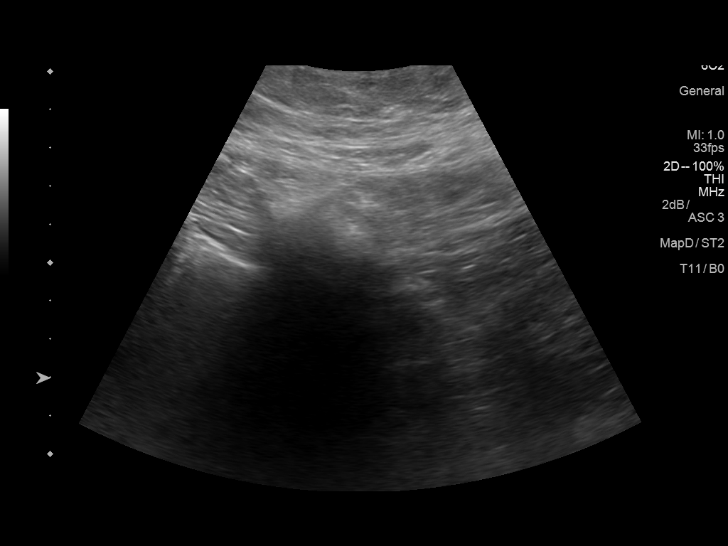

[13 of 25 positions shown; findings below may reference images not displayed]

FINDINGS: Gallbladder: The gallbladder is adequately distended. There is no
wall thickening or pericholecystic fluid or positive sonographic
Murphy's sign. No gallstones are evident.

Common bile duct: Diameter: 4.1 mm

Liver: The liver exhibits normal echotexture with no focal mass or
ductal dilation. The surface contour is normal.

IVC: No abnormality visualized.

Pancreas: The pancreas is mildly edematous in eggs exhibits
heterogeneous echotexture of the head and body. The tail is
partially obscured by bowel gas. The pancreatic duct measures 3 mm
in diameter. A small amount of fluid adjacent to the head of the
pancreas measuring 2.7 x 1.7 x 2.9 cm is observed.

Spleen: Size and appearance within normal limits.

Right Kidney: Length: 10.5 cm. Echogenicity within normal limits. No
mass or hydronephrosis visualized.

Left Kidney: Length: 11.9 cm. Echogenicity within normal limits. No
mass or hydronephrosis visualized.

Abdominal aorta: Bowel gas limits evaluation of the abdominal aorta.

Other findings: No ascites is demonstrated.
IMPRESSION: 1. Edematous pancreatic head and body consistent with pancreatitis.
The tail is obscured. A small pseudocyst adjacent to the pancreatic
head is suspected.
2. Normal appearance of the gallbladder and liver and spleen and
kidneys.

## 2016-06-28 ENCOUNTER — Encounter (HOSPITAL_COMMUNITY): Payer: BLUE CROSS/BLUE SHIELD

## 2016-07-02 ENCOUNTER — Ambulatory Visit (INDEPENDENT_AMBULATORY_CARE_PROVIDER_SITE_OTHER): Payer: BLUE CROSS/BLUE SHIELD | Admitting: Ophthalmology

## 2016-07-03 ENCOUNTER — Encounter: Payer: BLUE CROSS/BLUE SHIELD | Admitting: Vascular Surgery

## 2016-09-19 DIAGNOSIS — Z6833 Body mass index (BMI) 33.0-33.9, adult: Secondary | ICD-10-CM | POA: Diagnosis not present

## 2016-09-19 DIAGNOSIS — E1165 Type 2 diabetes mellitus with hyperglycemia: Secondary | ICD-10-CM | POA: Diagnosis not present

## 2016-09-19 DIAGNOSIS — E7800 Pure hypercholesterolemia, unspecified: Secondary | ICD-10-CM | POA: Diagnosis not present

## 2016-09-19 DIAGNOSIS — Z299 Encounter for prophylactic measures, unspecified: Secondary | ICD-10-CM | POA: Diagnosis not present

## 2016-09-19 DIAGNOSIS — I639 Cerebral infarction, unspecified: Secondary | ICD-10-CM | POA: Diagnosis not present

## 2016-11-12 DIAGNOSIS — Z299 Encounter for prophylactic measures, unspecified: Secondary | ICD-10-CM | POA: Diagnosis not present

## 2016-11-12 DIAGNOSIS — Z0000 Encounter for general adult medical examination without abnormal findings: Secondary | ICD-10-CM | POA: Diagnosis not present

## 2016-11-12 DIAGNOSIS — E7800 Pure hypercholesterolemia, unspecified: Secondary | ICD-10-CM | POA: Diagnosis not present

## 2016-11-12 DIAGNOSIS — E1165 Type 2 diabetes mellitus with hyperglycemia: Secondary | ICD-10-CM | POA: Diagnosis not present

## 2016-11-12 DIAGNOSIS — Z79899 Other long term (current) drug therapy: Secondary | ICD-10-CM | POA: Diagnosis not present

## 2016-11-12 DIAGNOSIS — Z1211 Encounter for screening for malignant neoplasm of colon: Secondary | ICD-10-CM | POA: Diagnosis not present

## 2016-11-12 DIAGNOSIS — Z1231 Encounter for screening mammogram for malignant neoplasm of breast: Secondary | ICD-10-CM | POA: Diagnosis not present

## 2016-11-12 DIAGNOSIS — I639 Cerebral infarction, unspecified: Secondary | ICD-10-CM | POA: Diagnosis not present

## 2016-11-12 DIAGNOSIS — Z1389 Encounter for screening for other disorder: Secondary | ICD-10-CM | POA: Diagnosis not present

## 2016-11-12 DIAGNOSIS — I10 Essential (primary) hypertension: Secondary | ICD-10-CM | POA: Diagnosis not present

## 2016-11-12 DIAGNOSIS — Z6832 Body mass index (BMI) 32.0-32.9, adult: Secondary | ICD-10-CM | POA: Diagnosis not present

## 2016-12-03 ENCOUNTER — Encounter: Payer: Self-pay | Admitting: Vascular Surgery

## 2016-12-18 ENCOUNTER — Ambulatory Visit (INDEPENDENT_AMBULATORY_CARE_PROVIDER_SITE_OTHER): Payer: BLUE CROSS/BLUE SHIELD | Admitting: Vascular Surgery

## 2016-12-18 ENCOUNTER — Ambulatory Visit (HOSPITAL_COMMUNITY)
Admission: RE | Admit: 2016-12-18 | Discharge: 2016-12-18 | Disposition: A | Discharge status: Home / Self Care | Payer: BLUE CROSS/BLUE SHIELD | Source: Ambulatory Visit | Attending: Vascular Surgery | Admitting: Vascular Surgery

## 2016-12-18 ENCOUNTER — Encounter: Payer: Self-pay | Admitting: Vascular Surgery

## 2016-12-18 VITALS — BP 140/77 | HR 65 | Temp 97.8°F | Resp 16 | Ht 64.0 in | Wt 193.0 lb

## 2016-12-18 DIAGNOSIS — I6521 Occlusion and stenosis of right carotid artery: Secondary | ICD-10-CM | POA: Insufficient documentation

## 2016-12-18 LAB — VAS US CAROTID
LCCADDIAS: 40 cm/s
LEFT ECA DIAS: -17 cm/s
LEFT VERTEBRAL DIAS: 25 cm/s
LICADDIAS: -29 cm/s
LICAPDIAS: -24 cm/s
LICAPSYS: -119 cm/s
Left CCA dist sys: 139 cm/s
Left CCA prox dias: -22 cm/s
Left CCA prox sys: -175 cm/s
Left ICA dist sys: -93 cm/s
RIGHT CCA MID DIAS: -26 cm/s
RIGHT ECA DIAS: -22 cm/s
RIGHT VERTEBRAL DIAS: 19 cm/s
Right CCA prox dias: -26 cm/s
Right CCA prox sys: -141 cm/s
Right cca dist sys: 172 cm/s

## 2016-12-18 NOTE — Progress Notes (Signed)
[facility]  Patient name: Stacy Hinton MRN: [id number] DOB: 05/15/1945 Sex: female  REASON FOR VISIT: follow-up  HPI:    Stacy Hinton is a 71 y.o. female who presents for continued follow-up of her right carotid stenosis. She was last seen in the office on 06/12/2016 by Dr. Scot Dock after she had suffered a left brain stroke. During her hospitalization, her workup revealed no significant left carotid stenosis. However, there was moderate right carotid stenosis of 50-69%. It was felt that her stroke and may have been secondary to extremely high blood pressure during her hospital admission.  Today, she denies any episodes of amaurosis fugax, sudden onset weakness or numbness of the extremities, expressive or receptive aphasia. She smokes 2-3 cigarettes daily. She takes aspirin and statin daily. She has been compliant with her hypertension medications.  PAST MEDICAL HISTORY:   Past Medical History:  Diagnosis Date  . High cholesterol   . Hypertension   . Pancreatitis   . Type II diabetes mellitus ([place])     No family history on file.  Social History  Substance Use Topics  . Smoking status: Current Every Day Smoker    Years: 10.00    Types: Cigarettes    Last attempt to quit: 07/05/1994  . Smokeless tobacco: Never Used     Comment: smokes abou 2-3 cigarettes a day x 1 yrs ago.   . Alcohol use No    No Known Allergies  MEDICATIONS:   Current Outpatient Prescriptions  Medication Sig Dispense Refill  . acetaminophen (TYLENOL) 500 MG tablet Take 500 mg by mouth daily.    Marland Kitchen amLODipine (NORVASC) 5 MG tablet Take 5 mg by mouth daily.    Marland Kitchen aspirin EC 81 MG tablet Take 81 mg by mouth daily.    Marland Kitchen atorvastatin (LIPITOR) 40 MG tablet Take 40 mg by mouth daily.    Marland Kitchen lisinopril (PRINIVIL,ZESTRIL) 10 MG tablet Take 10 mg by mouth daily.    . Multiple Vitamins-Minerals (MULTIVITAMINS THER. W/MINERALS) TABS Take 1 tablet by mouth daily.     No current  facility-administered medications for this visit.     REVIEW OF SYSTEMS:   REVIEW OF SYSTEMS (negative unless checked):   Cardiac:  []  Chest pain or chest pressure? []  Shortness of breath upon activity? []  Shortness of breath when lying flat? []  Irregular heart rhythm?  Vascular:  []  Pain in calf, thigh, or hip brought on by walking? []  Pain in feet at night that wakes you up from your sleep? []  Blood clot in your veins? []  Leg swelling?  Pulmonary:  []  Oxygen at home? []  Productive cough? []  Wheezing?  Neurologic:  []  Sudden weakness in arms or legs? []  Sudden numbness in arms or legs? []  Sudden onset of difficult speaking or slurred speech? []  Temporary loss of vision in one eye? []  Problems with dizziness?  Gastrointestinal:  []  Blood in stool? []  Vomited blood?  Genitourinary:  []  Burning when urinating? []  Blood in urine?  Psychiatric:  []  Major depression  Hematologic:  []  Bleeding problems? []  Problems with blood clotting?  Dermatologic:  []  Rashes or ulcers?  Constitutional:  []  Fever or chills?  Ear/Nose/Throat:  []  Change in hearing? []  Nose bleeds? []  Sore throat?  Musculoskeletal:  []  Back pain? []  Joint pain? []  Muscle pain?  PHYSICAL EXAM:   Vitals:   12/18/16 1400 12/18/16 1402  BP: (!) 141/78 140/77  Pulse: 65   Resp: 16   Temp: 97.8  F (36.6 C)   TempSrc: Oral   SpO2: 100%   Weight: 193 lb (87.5 kg)   Height: 5\' 4"  (1.626 m)     GENERAL: The patient is a well-nourished female, in no acute distress. The vital signs are documented above. HEENT: normocephalic, atraumatic. No abnormalities noted.  CARDIAC: There is a regular rate and rhythm. No carotid bruits.  VASCULAR: 2+ radial and 2+ dorsalis pedis pulses bilaterally.  PULMONARY: Non labored respiratory effort. Lungs clear to auscultation bilaterally.  MUSCULOSKELETAL: There are no major deformities or cyanosis. NEUROLOGIC: No focal deficits.  SKIN: There are no  ulcers or rashes noted. PSYCHIATRIC: The patient has a normal affect.  DATA:    Carotid duplex 12/18/16  Left ICA <40% Right ICA 40-59% Vertebral arteries with antegrade folw  ASSESSMENT/PLAN:   Asymptomatic right carotid stenosis 40-59%  No TIA or stroke symptoms. Right carotid stenosis has been stable since last duplex six months ago. Takes aspirin and statin daily. Her hypertension is medically managed. Have encouraged her to stay active and consume a healthy diet. Patient was counseled on smoking cessation. She will follow-up in one year with repeat carotid duplex. She knows to contact us sooner if she develops any neurological issues.   Virgina Jock, PA-C [facility] MD: Scot Dock

## 2016-12-27 NOTE — Addendum Note (Signed)
Addended by: Lianne Cure A on: 12/27/2016 03:37 PM   Modules accepted: Orders

## 2017-01-30 DIAGNOSIS — I10 Essential (primary) hypertension: Secondary | ICD-10-CM | POA: Diagnosis not present

## 2017-01-30 DIAGNOSIS — E739 Lactose intolerance, unspecified: Secondary | ICD-10-CM | POA: Diagnosis not present

## 2017-01-30 DIAGNOSIS — K644 Residual hemorrhoidal skin tags: Secondary | ICD-10-CM | POA: Diagnosis not present

## 2017-01-30 DIAGNOSIS — D126 Benign neoplasm of colon, unspecified: Secondary | ICD-10-CM | POA: Diagnosis not present

## 2017-01-30 DIAGNOSIS — F17200 Nicotine dependence, unspecified, uncomplicated: Secondary | ICD-10-CM | POA: Diagnosis not present

## 2017-01-30 DIAGNOSIS — E119 Type 2 diabetes mellitus without complications: Secondary | ICD-10-CM | POA: Diagnosis not present

## 2017-01-30 DIAGNOSIS — Z1211 Encounter for screening for malignant neoplasm of colon: Secondary | ICD-10-CM | POA: Diagnosis not present

## 2017-01-30 DIAGNOSIS — K5730 Diverticulosis of large intestine without perforation or abscess without bleeding: Secondary | ICD-10-CM | POA: Diagnosis not present

## 2017-01-30 DIAGNOSIS — Z8673 Personal history of transient ischemic attack (TIA), and cerebral infarction without residual deficits: Secondary | ICD-10-CM | POA: Diagnosis not present

## 2017-02-17 DIAGNOSIS — D126 Benign neoplasm of colon, unspecified: Secondary | ICD-10-CM | POA: Diagnosis not present

## 2017-02-19 DIAGNOSIS — Z713 Dietary counseling and surveillance: Secondary | ICD-10-CM | POA: Diagnosis not present

## 2017-02-19 DIAGNOSIS — I10 Essential (primary) hypertension: Secondary | ICD-10-CM | POA: Diagnosis not present

## 2017-02-19 DIAGNOSIS — E1165 Type 2 diabetes mellitus with hyperglycemia: Secondary | ICD-10-CM | POA: Diagnosis not present

## 2017-02-19 DIAGNOSIS — Z299 Encounter for prophylactic measures, unspecified: Secondary | ICD-10-CM | POA: Diagnosis not present

## 2017-02-19 DIAGNOSIS — Z6832 Body mass index (BMI) 32.0-32.9, adult: Secondary | ICD-10-CM | POA: Diagnosis not present

## 2017-09-19 DIAGNOSIS — I10 Essential (primary) hypertension: Secondary | ICD-10-CM | POA: Diagnosis not present

## 2017-09-19 DIAGNOSIS — Z6833 Body mass index (BMI) 33.0-33.9, adult: Secondary | ICD-10-CM | POA: Diagnosis not present

## 2017-09-19 DIAGNOSIS — Z713 Dietary counseling and surveillance: Secondary | ICD-10-CM | POA: Diagnosis not present

## 2017-09-19 DIAGNOSIS — Z299 Encounter for prophylactic measures, unspecified: Secondary | ICD-10-CM | POA: Diagnosis not present

## 2017-09-19 DIAGNOSIS — E1165 Type 2 diabetes mellitus with hyperglycemia: Secondary | ICD-10-CM | POA: Diagnosis not present

## 2017-11-21 DIAGNOSIS — Z1211 Encounter for screening for malignant neoplasm of colon: Secondary | ICD-10-CM | POA: Diagnosis not present

## 2017-11-21 DIAGNOSIS — Z6833 Body mass index (BMI) 33.0-33.9, adult: Secondary | ICD-10-CM | POA: Diagnosis not present

## 2017-11-21 DIAGNOSIS — Z299 Encounter for prophylactic measures, unspecified: Secondary | ICD-10-CM | POA: Diagnosis not present

## 2017-11-21 DIAGNOSIS — Z79899 Other long term (current) drug therapy: Secondary | ICD-10-CM | POA: Diagnosis not present

## 2017-11-21 DIAGNOSIS — F17210 Nicotine dependence, cigarettes, uncomplicated: Secondary | ICD-10-CM | POA: Diagnosis not present

## 2017-11-21 DIAGNOSIS — Z0000 Encounter for general adult medical examination without abnormal findings: Secondary | ICD-10-CM | POA: Diagnosis not present

## 2017-11-21 DIAGNOSIS — Z1331 Encounter for screening for depression: Secondary | ICD-10-CM | POA: Diagnosis not present

## 2017-11-21 DIAGNOSIS — I10 Essential (primary) hypertension: Secondary | ICD-10-CM | POA: Diagnosis not present

## 2017-11-21 DIAGNOSIS — E1165 Type 2 diabetes mellitus with hyperglycemia: Secondary | ICD-10-CM | POA: Diagnosis not present

## 2017-11-21 DIAGNOSIS — E7800 Pure hypercholesterolemia, unspecified: Secondary | ICD-10-CM | POA: Diagnosis not present

## 2017-11-27 DIAGNOSIS — Z299 Encounter for prophylactic measures, unspecified: Secondary | ICD-10-CM | POA: Diagnosis not present

## 2017-12-29 DIAGNOSIS — Z87891 Personal history of nicotine dependence: Secondary | ICD-10-CM | POA: Diagnosis not present

## 2017-12-29 DIAGNOSIS — Z299 Encounter for prophylactic measures, unspecified: Secondary | ICD-10-CM | POA: Diagnosis not present

## 2017-12-29 DIAGNOSIS — Z6833 Body mass index (BMI) 33.0-33.9, adult: Secondary | ICD-10-CM | POA: Diagnosis not present

## 2017-12-29 DIAGNOSIS — I10 Essential (primary) hypertension: Secondary | ICD-10-CM | POA: Diagnosis not present

## 2017-12-29 DIAGNOSIS — E1165 Type 2 diabetes mellitus with hyperglycemia: Secondary | ICD-10-CM | POA: Diagnosis not present

## 2017-12-29 DIAGNOSIS — E7800 Pure hypercholesterolemia, unspecified: Secondary | ICD-10-CM | POA: Diagnosis not present

## 2018-04-06 DIAGNOSIS — E1165 Type 2 diabetes mellitus with hyperglycemia: Secondary | ICD-10-CM | POA: Diagnosis not present

## 2018-04-06 DIAGNOSIS — F17210 Nicotine dependence, cigarettes, uncomplicated: Secondary | ICD-10-CM | POA: Diagnosis not present

## 2018-04-06 DIAGNOSIS — Z299 Encounter for prophylactic measures, unspecified: Secondary | ICD-10-CM | POA: Diagnosis not present

## 2018-04-06 DIAGNOSIS — I10 Essential (primary) hypertension: Secondary | ICD-10-CM | POA: Diagnosis not present

## 2018-04-06 DIAGNOSIS — Z2821 Immunization not carried out because of patient refusal: Secondary | ICD-10-CM | POA: Diagnosis not present

## 2018-04-06 DIAGNOSIS — E7800 Pure hypercholesterolemia, unspecified: Secondary | ICD-10-CM | POA: Diagnosis not present

## 2018-04-06 DIAGNOSIS — Z6833 Body mass index (BMI) 33.0-33.9, adult: Secondary | ICD-10-CM | POA: Diagnosis not present

## 2018-07-17 DIAGNOSIS — I10 Essential (primary) hypertension: Secondary | ICD-10-CM | POA: Diagnosis not present

## 2018-07-17 DIAGNOSIS — I639 Cerebral infarction, unspecified: Secondary | ICD-10-CM | POA: Diagnosis not present

## 2018-07-17 DIAGNOSIS — Z6833 Body mass index (BMI) 33.0-33.9, adult: Secondary | ICD-10-CM | POA: Diagnosis not present

## 2018-07-17 DIAGNOSIS — E1165 Type 2 diabetes mellitus with hyperglycemia: Secondary | ICD-10-CM | POA: Diagnosis not present

## 2018-07-17 DIAGNOSIS — Z299 Encounter for prophylactic measures, unspecified: Secondary | ICD-10-CM | POA: Diagnosis not present

## 2018-07-17 DIAGNOSIS — F17210 Nicotine dependence, cigarettes, uncomplicated: Secondary | ICD-10-CM | POA: Diagnosis not present

## 2018-10-28 DIAGNOSIS — Z6832 Body mass index (BMI) 32.0-32.9, adult: Secondary | ICD-10-CM | POA: Diagnosis not present

## 2018-10-28 DIAGNOSIS — Z713 Dietary counseling and surveillance: Secondary | ICD-10-CM | POA: Diagnosis not present

## 2018-10-28 DIAGNOSIS — Z299 Encounter for prophylactic measures, unspecified: Secondary | ICD-10-CM | POA: Diagnosis not present

## 2018-10-28 DIAGNOSIS — I10 Essential (primary) hypertension: Secondary | ICD-10-CM | POA: Diagnosis not present

## 2018-10-28 DIAGNOSIS — E1165 Type 2 diabetes mellitus with hyperglycemia: Secondary | ICD-10-CM | POA: Diagnosis not present

## 2018-11-18 DIAGNOSIS — M40204 Unspecified kyphosis, thoracic region: Secondary | ICD-10-CM | POA: Diagnosis not present

## 2018-11-18 DIAGNOSIS — M546 Pain in thoracic spine: Secondary | ICD-10-CM | POA: Diagnosis not present

## 2018-12-01 DIAGNOSIS — R5383 Other fatigue: Secondary | ICD-10-CM | POA: Diagnosis not present

## 2018-12-01 DIAGNOSIS — Z0000 Encounter for general adult medical examination without abnormal findings: Secondary | ICD-10-CM | POA: Diagnosis not present

## 2018-12-01 DIAGNOSIS — Z79899 Other long term (current) drug therapy: Secondary | ICD-10-CM | POA: Diagnosis not present

## 2018-12-01 DIAGNOSIS — E7800 Pure hypercholesterolemia, unspecified: Secondary | ICD-10-CM | POA: Diagnosis not present

## 2019-05-31 DIAGNOSIS — R42 Dizziness and giddiness: Secondary | ICD-10-CM | POA: Diagnosis not present

## 2019-05-31 DIAGNOSIS — I6523 Occlusion and stenosis of bilateral carotid arteries: Secondary | ICD-10-CM | POA: Diagnosis not present

## 2019-06-03 DIAGNOSIS — F17210 Nicotine dependence, cigarettes, uncomplicated: Secondary | ICD-10-CM | POA: Diagnosis not present

## 2019-06-03 DIAGNOSIS — Z6833 Body mass index (BMI) 33.0-33.9, adult: Secondary | ICD-10-CM | POA: Diagnosis not present

## 2019-06-03 DIAGNOSIS — Z299 Encounter for prophylactic measures, unspecified: Secondary | ICD-10-CM | POA: Diagnosis not present

## 2019-06-03 DIAGNOSIS — E7800 Pure hypercholesterolemia, unspecified: Secondary | ICD-10-CM | POA: Diagnosis not present

## 2019-06-03 DIAGNOSIS — R42 Dizziness and giddiness: Secondary | ICD-10-CM | POA: Diagnosis not present

## 2019-06-03 DIAGNOSIS — E1165 Type 2 diabetes mellitus with hyperglycemia: Secondary | ICD-10-CM | POA: Diagnosis not present

## 2019-06-03 DIAGNOSIS — I10 Essential (primary) hypertension: Secondary | ICD-10-CM | POA: Diagnosis not present

## 2019-07-10 DIAGNOSIS — Z23 Encounter for immunization: Secondary | ICD-10-CM | POA: Diagnosis not present

## 2020-08-07 DIAGNOSIS — E1165 Type 2 diabetes mellitus with hyperglycemia: Secondary | ICD-10-CM | POA: Diagnosis not present

## 2020-08-07 DIAGNOSIS — I10 Essential (primary) hypertension: Secondary | ICD-10-CM | POA: Diagnosis not present

## 2020-08-07 DIAGNOSIS — Z299 Encounter for prophylactic measures, unspecified: Secondary | ICD-10-CM | POA: Diagnosis not present

## 2020-08-07 DIAGNOSIS — I779 Disorder of arteries and arterioles, unspecified: Secondary | ICD-10-CM | POA: Diagnosis not present

## 2020-08-07 DIAGNOSIS — E7800 Pure hypercholesterolemia, unspecified: Secondary | ICD-10-CM | POA: Diagnosis not present

## 2020-11-21 DIAGNOSIS — Z299 Encounter for prophylactic measures, unspecified: Secondary | ICD-10-CM | POA: Diagnosis not present

## 2020-11-21 DIAGNOSIS — I10 Essential (primary) hypertension: Secondary | ICD-10-CM | POA: Diagnosis not present

## 2020-11-21 DIAGNOSIS — E1165 Type 2 diabetes mellitus with hyperglycemia: Secondary | ICD-10-CM | POA: Diagnosis not present

## 2020-12-11 DIAGNOSIS — Z299 Encounter for prophylactic measures, unspecified: Secondary | ICD-10-CM | POA: Diagnosis not present

## 2020-12-11 DIAGNOSIS — E669 Obesity, unspecified: Secondary | ICD-10-CM | POA: Diagnosis not present

## 2020-12-11 DIAGNOSIS — R5383 Other fatigue: Secondary | ICD-10-CM | POA: Diagnosis not present

## 2020-12-11 DIAGNOSIS — Z6832 Body mass index (BMI) 32.0-32.9, adult: Secondary | ICD-10-CM | POA: Diagnosis not present

## 2020-12-11 DIAGNOSIS — I10 Essential (primary) hypertension: Secondary | ICD-10-CM | POA: Diagnosis not present

## 2020-12-11 DIAGNOSIS — Z79899 Other long term (current) drug therapy: Secondary | ICD-10-CM | POA: Diagnosis not present

## 2020-12-11 DIAGNOSIS — Z7189 Other specified counseling: Secondary | ICD-10-CM | POA: Diagnosis not present

## 2020-12-11 DIAGNOSIS — Z0000 Encounter for general adult medical examination without abnormal findings: Secondary | ICD-10-CM | POA: Diagnosis not present

## 2020-12-11 DIAGNOSIS — Z1339 Encounter for screening examination for other mental health and behavioral disorders: Secondary | ICD-10-CM | POA: Diagnosis not present

## 2020-12-11 DIAGNOSIS — E7800 Pure hypercholesterolemia, unspecified: Secondary | ICD-10-CM | POA: Diagnosis not present

## 2020-12-11 DIAGNOSIS — Z1331 Encounter for screening for depression: Secondary | ICD-10-CM | POA: Diagnosis not present

## 2021-02-27 DIAGNOSIS — Z299 Encounter for prophylactic measures, unspecified: Secondary | ICD-10-CM | POA: Diagnosis not present

## 2021-02-27 DIAGNOSIS — E1165 Type 2 diabetes mellitus with hyperglycemia: Secondary | ICD-10-CM | POA: Diagnosis not present

## 2021-02-27 DIAGNOSIS — I10 Essential (primary) hypertension: Secondary | ICD-10-CM | POA: Diagnosis not present

## 2021-02-27 DIAGNOSIS — Z6831 Body mass index (BMI) 31.0-31.9, adult: Secondary | ICD-10-CM | POA: Diagnosis not present

## 2021-02-27 DIAGNOSIS — Z2821 Immunization not carried out because of patient refusal: Secondary | ICD-10-CM | POA: Diagnosis not present

## 2021-03-21 ENCOUNTER — Other Ambulatory Visit: Payer: Self-pay | Admitting: Internal Medicine

## 2021-03-21 DIAGNOSIS — Z139 Encounter for screening, unspecified: Secondary | ICD-10-CM

## 2021-04-04 ENCOUNTER — Inpatient Hospital Stay: Admission: RE | Admit: 2021-04-04 | Payer: BLUE CROSS/BLUE SHIELD | Source: Ambulatory Visit

## 2021-06-04 DIAGNOSIS — I779 Disorder of arteries and arterioles, unspecified: Secondary | ICD-10-CM | POA: Diagnosis not present

## 2021-06-04 DIAGNOSIS — E1165 Type 2 diabetes mellitus with hyperglycemia: Secondary | ICD-10-CM | POA: Diagnosis not present

## 2021-06-04 DIAGNOSIS — I10 Essential (primary) hypertension: Secondary | ICD-10-CM | POA: Diagnosis not present

## 2021-06-04 DIAGNOSIS — I739 Peripheral vascular disease, unspecified: Secondary | ICD-10-CM | POA: Diagnosis not present

## 2021-06-04 DIAGNOSIS — Z299 Encounter for prophylactic measures, unspecified: Secondary | ICD-10-CM | POA: Diagnosis not present

## 2021-07-23 DIAGNOSIS — H25813 Combined forms of age-related cataract, bilateral: Secondary | ICD-10-CM | POA: Diagnosis not present

## 2021-07-23 DIAGNOSIS — H5213 Myopia, bilateral: Secondary | ICD-10-CM | POA: Diagnosis not present

## 2021-07-23 DIAGNOSIS — H52223 Regular astigmatism, bilateral: Secondary | ICD-10-CM | POA: Diagnosis not present

## 2021-07-23 DIAGNOSIS — E119 Type 2 diabetes mellitus without complications: Secondary | ICD-10-CM | POA: Diagnosis not present

## 2021-07-23 DIAGNOSIS — H35342 Macular cyst, hole, or pseudohole, left eye: Secondary | ICD-10-CM | POA: Diagnosis not present

## 2021-07-23 DIAGNOSIS — H524 Presbyopia: Secondary | ICD-10-CM | POA: Diagnosis not present

## 2021-08-29 DIAGNOSIS — H2512 Age-related nuclear cataract, left eye: Secondary | ICD-10-CM | POA: Diagnosis not present

## 2021-09-18 DIAGNOSIS — I10 Essential (primary) hypertension: Secondary | ICD-10-CM | POA: Diagnosis not present

## 2021-09-18 DIAGNOSIS — Z299 Encounter for prophylactic measures, unspecified: Secondary | ICD-10-CM | POA: Diagnosis not present

## 2021-09-18 DIAGNOSIS — Z713 Dietary counseling and surveillance: Secondary | ICD-10-CM | POA: Diagnosis not present

## 2021-09-18 DIAGNOSIS — F17210 Nicotine dependence, cigarettes, uncomplicated: Secondary | ICD-10-CM | POA: Diagnosis not present

## 2021-09-18 DIAGNOSIS — Z6831 Body mass index (BMI) 31.0-31.9, adult: Secondary | ICD-10-CM | POA: Diagnosis not present

## 2021-09-18 DIAGNOSIS — E1165 Type 2 diabetes mellitus with hyperglycemia: Secondary | ICD-10-CM | POA: Diagnosis not present

## 2021-12-14 DIAGNOSIS — Z1331 Encounter for screening for depression: Secondary | ICD-10-CM | POA: Diagnosis not present

## 2021-12-14 DIAGNOSIS — Z0000 Encounter for general adult medical examination without abnormal findings: Secondary | ICD-10-CM | POA: Diagnosis not present

## 2021-12-14 DIAGNOSIS — Z1339 Encounter for screening examination for other mental health and behavioral disorders: Secondary | ICD-10-CM | POA: Diagnosis not present

## 2021-12-14 DIAGNOSIS — Z6831 Body mass index (BMI) 31.0-31.9, adult: Secondary | ICD-10-CM | POA: Diagnosis not present

## 2021-12-14 DIAGNOSIS — Z299 Encounter for prophylactic measures, unspecified: Secondary | ICD-10-CM | POA: Diagnosis not present

## 2021-12-14 DIAGNOSIS — R5383 Other fatigue: Secondary | ICD-10-CM | POA: Diagnosis not present

## 2021-12-14 DIAGNOSIS — I10 Essential (primary) hypertension: Secondary | ICD-10-CM | POA: Diagnosis not present

## 2021-12-14 DIAGNOSIS — E6609 Other obesity due to excess calories: Secondary | ICD-10-CM | POA: Diagnosis not present

## 2021-12-14 DIAGNOSIS — Z7189 Other specified counseling: Secondary | ICD-10-CM | POA: Diagnosis not present

## 2022-03-18 DIAGNOSIS — E1165 Type 2 diabetes mellitus with hyperglycemia: Secondary | ICD-10-CM | POA: Diagnosis not present

## 2022-03-18 DIAGNOSIS — Z6832 Body mass index (BMI) 32.0-32.9, adult: Secondary | ICD-10-CM | POA: Diagnosis not present

## 2022-03-18 DIAGNOSIS — Z87891 Personal history of nicotine dependence: Secondary | ICD-10-CM | POA: Diagnosis not present

## 2022-03-18 DIAGNOSIS — Z299 Encounter for prophylactic measures, unspecified: Secondary | ICD-10-CM | POA: Diagnosis not present

## 2022-03-18 DIAGNOSIS — E6609 Other obesity due to excess calories: Secondary | ICD-10-CM | POA: Diagnosis not present

## 2022-03-18 DIAGNOSIS — R5383 Other fatigue: Secondary | ICD-10-CM | POA: Diagnosis not present

## 2022-03-18 DIAGNOSIS — I10 Essential (primary) hypertension: Secondary | ICD-10-CM | POA: Diagnosis not present

## 2022-03-18 DIAGNOSIS — Z79899 Other long term (current) drug therapy: Secondary | ICD-10-CM | POA: Diagnosis not present

## 2022-03-18 DIAGNOSIS — E7800 Pure hypercholesterolemia, unspecified: Secondary | ICD-10-CM | POA: Diagnosis not present

## 2022-03-18 DIAGNOSIS — Z0000 Encounter for general adult medical examination without abnormal findings: Secondary | ICD-10-CM | POA: Diagnosis not present

## 2022-07-15 DIAGNOSIS — E1165 Type 2 diabetes mellitus with hyperglycemia: Secondary | ICD-10-CM | POA: Diagnosis not present

## 2022-07-15 DIAGNOSIS — Z299 Encounter for prophylactic measures, unspecified: Secondary | ICD-10-CM | POA: Diagnosis not present

## 2022-07-15 DIAGNOSIS — I10 Essential (primary) hypertension: Secondary | ICD-10-CM | POA: Diagnosis not present

## 2022-08-30 DIAGNOSIS — E785 Hyperlipidemia, unspecified: Secondary | ICD-10-CM | POA: Diagnosis not present

## 2022-08-30 DIAGNOSIS — I872 Venous insufficiency (chronic) (peripheral): Secondary | ICD-10-CM | POA: Diagnosis not present

## 2022-08-30 DIAGNOSIS — R21 Rash and other nonspecific skin eruption: Secondary | ICD-10-CM | POA: Diagnosis not present

## 2022-08-30 DIAGNOSIS — E119 Type 2 diabetes mellitus without complications: Secondary | ICD-10-CM | POA: Diagnosis not present

## 2022-08-30 DIAGNOSIS — I10 Essential (primary) hypertension: Secondary | ICD-10-CM | POA: Diagnosis not present

## 2022-08-30 DIAGNOSIS — T783XXA Angioneurotic edema, initial encounter: Secondary | ICD-10-CM | POA: Diagnosis not present

## 2022-08-30 DIAGNOSIS — R600 Localized edema: Secondary | ICD-10-CM | POA: Diagnosis not present

## 2022-09-02 DIAGNOSIS — I10 Essential (primary) hypertension: Secondary | ICD-10-CM | POA: Diagnosis not present

## 2022-09-02 DIAGNOSIS — T7840XA Allergy, unspecified, initial encounter: Secondary | ICD-10-CM | POA: Diagnosis not present

## 2022-09-02 DIAGNOSIS — Z299 Encounter for prophylactic measures, unspecified: Secondary | ICD-10-CM | POA: Diagnosis not present

## 2022-09-16 DIAGNOSIS — Z6833 Body mass index (BMI) 33.0-33.9, adult: Secondary | ICD-10-CM | POA: Diagnosis not present

## 2022-09-16 DIAGNOSIS — I10 Essential (primary) hypertension: Secondary | ICD-10-CM | POA: Diagnosis not present

## 2022-09-16 DIAGNOSIS — Z0000 Encounter for general adult medical examination without abnormal findings: Secondary | ICD-10-CM | POA: Diagnosis not present

## 2022-09-16 DIAGNOSIS — Z1331 Encounter for screening for depression: Secondary | ICD-10-CM | POA: Diagnosis not present

## 2022-09-16 DIAGNOSIS — Z87891 Personal history of nicotine dependence: Secondary | ICD-10-CM | POA: Diagnosis not present

## 2022-09-16 DIAGNOSIS — E6609 Other obesity due to excess calories: Secondary | ICD-10-CM | POA: Diagnosis not present

## 2022-09-16 DIAGNOSIS — Z7189 Other specified counseling: Secondary | ICD-10-CM | POA: Diagnosis not present

## 2022-09-16 DIAGNOSIS — Z299 Encounter for prophylactic measures, unspecified: Secondary | ICD-10-CM | POA: Diagnosis not present

## 2022-09-16 DIAGNOSIS — Z1339 Encounter for screening examination for other mental health and behavioral disorders: Secondary | ICD-10-CM | POA: Diagnosis not present

## 2022-09-27 DIAGNOSIS — R21 Rash and other nonspecific skin eruption: Secondary | ICD-10-CM | POA: Diagnosis not present

## 2022-09-27 DIAGNOSIS — I10 Essential (primary) hypertension: Secondary | ICD-10-CM | POA: Diagnosis not present

## 2022-09-27 DIAGNOSIS — Z299 Encounter for prophylactic measures, unspecified: Secondary | ICD-10-CM | POA: Diagnosis not present

## 2022-10-29 DIAGNOSIS — I152 Hypertension secondary to endocrine disorders: Secondary | ICD-10-CM | POA: Diagnosis not present

## 2022-10-29 DIAGNOSIS — Z299 Encounter for prophylactic measures, unspecified: Secondary | ICD-10-CM | POA: Diagnosis not present

## 2022-10-29 DIAGNOSIS — E1159 Type 2 diabetes mellitus with other circulatory complications: Secondary | ICD-10-CM | POA: Diagnosis not present

## 2022-10-29 DIAGNOSIS — E1165 Type 2 diabetes mellitus with hyperglycemia: Secondary | ICD-10-CM | POA: Diagnosis not present

## 2022-10-29 DIAGNOSIS — I10 Essential (primary) hypertension: Secondary | ICD-10-CM | POA: Diagnosis not present

## 2022-12-23 DIAGNOSIS — Z0000 Encounter for general adult medical examination without abnormal findings: Secondary | ICD-10-CM | POA: Diagnosis not present

## 2022-12-23 DIAGNOSIS — Z299 Encounter for prophylactic measures, unspecified: Secondary | ICD-10-CM | POA: Diagnosis not present

## 2022-12-23 DIAGNOSIS — R5383 Other fatigue: Secondary | ICD-10-CM | POA: Diagnosis not present

## 2022-12-23 DIAGNOSIS — Z79899 Other long term (current) drug therapy: Secondary | ICD-10-CM | POA: Diagnosis not present

## 2022-12-23 DIAGNOSIS — E7800 Pure hypercholesterolemia, unspecified: Secondary | ICD-10-CM | POA: Diagnosis not present

## 2022-12-23 DIAGNOSIS — I10 Essential (primary) hypertension: Secondary | ICD-10-CM | POA: Diagnosis not present

## 2022-12-24 DIAGNOSIS — E7800 Pure hypercholesterolemia, unspecified: Secondary | ICD-10-CM | POA: Diagnosis not present

## 2022-12-24 DIAGNOSIS — Z79899 Other long term (current) drug therapy: Secondary | ICD-10-CM | POA: Diagnosis not present

## 2022-12-24 DIAGNOSIS — R5383 Other fatigue: Secondary | ICD-10-CM | POA: Diagnosis not present

## 2023-03-05 DIAGNOSIS — Z299 Encounter for prophylactic measures, unspecified: Secondary | ICD-10-CM | POA: Diagnosis not present

## 2023-03-05 DIAGNOSIS — I10 Essential (primary) hypertension: Secondary | ICD-10-CM | POA: Diagnosis not present

## 2023-03-05 DIAGNOSIS — E1165 Type 2 diabetes mellitus with hyperglycemia: Secondary | ICD-10-CM | POA: Diagnosis not present

## 2023-03-10 DIAGNOSIS — E119 Type 2 diabetes mellitus without complications: Secondary | ICD-10-CM | POA: Diagnosis not present

## 2023-06-16 DIAGNOSIS — I739 Peripheral vascular disease, unspecified: Secondary | ICD-10-CM | POA: Diagnosis not present

## 2023-06-16 DIAGNOSIS — E1159 Type 2 diabetes mellitus with other circulatory complications: Secondary | ICD-10-CM | POA: Diagnosis not present

## 2023-06-16 DIAGNOSIS — I10 Essential (primary) hypertension: Secondary | ICD-10-CM | POA: Diagnosis not present

## 2023-06-16 DIAGNOSIS — E1165 Type 2 diabetes mellitus with hyperglycemia: Secondary | ICD-10-CM | POA: Diagnosis not present

## 2023-06-16 DIAGNOSIS — Z299 Encounter for prophylactic measures, unspecified: Secondary | ICD-10-CM | POA: Diagnosis not present

## 2023-06-16 DIAGNOSIS — I152 Hypertension secondary to endocrine disorders: Secondary | ICD-10-CM | POA: Diagnosis not present

## 2023-09-01 DIAGNOSIS — Z6832 Body mass index (BMI) 32.0-32.9, adult: Secondary | ICD-10-CM | POA: Diagnosis not present

## 2023-09-01 DIAGNOSIS — Z0000 Encounter for general adult medical examination without abnormal findings: Secondary | ICD-10-CM | POA: Diagnosis not present

## 2023-09-01 DIAGNOSIS — E6609 Other obesity due to excess calories: Secondary | ICD-10-CM | POA: Diagnosis not present

## 2023-09-01 DIAGNOSIS — I739 Peripheral vascular disease, unspecified: Secondary | ICD-10-CM | POA: Diagnosis not present

## 2023-09-01 DIAGNOSIS — Z1331 Encounter for screening for depression: Secondary | ICD-10-CM | POA: Diagnosis not present

## 2023-09-01 DIAGNOSIS — I10 Essential (primary) hypertension: Secondary | ICD-10-CM | POA: Diagnosis not present

## 2023-09-01 DIAGNOSIS — Z1339 Encounter for screening examination for other mental health and behavioral disorders: Secondary | ICD-10-CM | POA: Diagnosis not present

## 2023-09-01 DIAGNOSIS — Z299 Encounter for prophylactic measures, unspecified: Secondary | ICD-10-CM | POA: Diagnosis not present

## 2023-09-01 DIAGNOSIS — Z7189 Other specified counseling: Secondary | ICD-10-CM | POA: Diagnosis not present

## 2023-10-14 DIAGNOSIS — I10 Essential (primary) hypertension: Secondary | ICD-10-CM | POA: Diagnosis not present

## 2023-10-14 DIAGNOSIS — E1165 Type 2 diabetes mellitus with hyperglycemia: Secondary | ICD-10-CM | POA: Diagnosis not present

## 2023-10-14 DIAGNOSIS — Z299 Encounter for prophylactic measures, unspecified: Secondary | ICD-10-CM | POA: Diagnosis not present

## 2023-12-30 DIAGNOSIS — Z0000 Encounter for general adult medical examination without abnormal findings: Secondary | ICD-10-CM | POA: Diagnosis not present

## 2023-12-30 DIAGNOSIS — Z79899 Other long term (current) drug therapy: Secondary | ICD-10-CM | POA: Diagnosis not present

## 2023-12-30 DIAGNOSIS — I10 Essential (primary) hypertension: Secondary | ICD-10-CM | POA: Diagnosis not present

## 2023-12-30 DIAGNOSIS — R5383 Other fatigue: Secondary | ICD-10-CM | POA: Diagnosis not present

## 2023-12-30 DIAGNOSIS — Z299 Encounter for prophylactic measures, unspecified: Secondary | ICD-10-CM | POA: Diagnosis not present

## 2023-12-30 DIAGNOSIS — E7800 Pure hypercholesterolemia, unspecified: Secondary | ICD-10-CM | POA: Diagnosis not present

## 2024-01-04 DIAGNOSIS — M6281 Muscle weakness (generalized): Secondary | ICD-10-CM | POA: Diagnosis not present

## 2024-01-04 DIAGNOSIS — Z743 Need for continuous supervision: Secondary | ICD-10-CM | POA: Diagnosis not present

## 2024-01-04 DIAGNOSIS — R2689 Other abnormalities of gait and mobility: Secondary | ICD-10-CM | POA: Diagnosis not present

## 2024-01-04 DIAGNOSIS — C7951 Secondary malignant neoplasm of bone: Secondary | ICD-10-CM | POA: Diagnosis not present

## 2024-01-04 DIAGNOSIS — N6315 Unspecified lump in the right breast, overlapping quadrants: Secondary | ICD-10-CM | POA: Diagnosis not present

## 2024-01-04 DIAGNOSIS — R0989 Other specified symptoms and signs involving the circulatory and respiratory systems: Secondary | ICD-10-CM | POA: Diagnosis not present

## 2024-01-04 DIAGNOSIS — R2681 Unsteadiness on feet: Secondary | ICD-10-CM | POA: Diagnosis not present

## 2024-01-04 DIAGNOSIS — R928 Other abnormal and inconclusive findings on diagnostic imaging of breast: Secondary | ICD-10-CM | POA: Diagnosis not present

## 2024-01-04 DIAGNOSIS — M25552 Pain in left hip: Secondary | ICD-10-CM | POA: Diagnosis not present

## 2024-01-04 DIAGNOSIS — W06XXXA Fall from bed, initial encounter: Secondary | ICD-10-CM | POA: Diagnosis not present

## 2024-01-04 DIAGNOSIS — Z170 Estrogen receptor positive status [ER+]: Secondary | ICD-10-CM | POA: Diagnosis not present

## 2024-01-04 DIAGNOSIS — N6312 Unspecified lump in the right breast, upper inner quadrant: Secondary | ICD-10-CM | POA: Diagnosis not present

## 2024-01-04 DIAGNOSIS — I6930 Unspecified sequelae of cerebral infarction: Secondary | ICD-10-CM | POA: Diagnosis not present

## 2024-01-04 DIAGNOSIS — M6282 Rhabdomyolysis: Secondary | ICD-10-CM | POA: Diagnosis not present

## 2024-01-04 DIAGNOSIS — R262 Difficulty in walking, not elsewhere classified: Secondary | ICD-10-CM | POA: Diagnosis not present

## 2024-01-04 DIAGNOSIS — I10 Essential (primary) hypertension: Secondary | ICD-10-CM | POA: Diagnosis not present

## 2024-01-04 DIAGNOSIS — R531 Weakness: Secondary | ICD-10-CM | POA: Diagnosis not present

## 2024-01-04 DIAGNOSIS — N6310 Unspecified lump in the right breast, unspecified quadrant: Secondary | ICD-10-CM | POA: Diagnosis not present

## 2024-01-04 DIAGNOSIS — E785 Hyperlipidemia, unspecified: Secondary | ICD-10-CM | POA: Diagnosis not present

## 2024-01-04 DIAGNOSIS — I619 Nontraumatic intracerebral hemorrhage, unspecified: Secondary | ICD-10-CM | POA: Diagnosis not present

## 2024-01-04 DIAGNOSIS — E1169 Type 2 diabetes mellitus with other specified complication: Secondary | ICD-10-CM | POA: Diagnosis not present

## 2024-01-04 DIAGNOSIS — E119 Type 2 diabetes mellitus without complications: Secondary | ICD-10-CM | POA: Diagnosis not present

## 2024-01-04 DIAGNOSIS — R222 Localized swelling, mass and lump, trunk: Secondary | ICD-10-CM | POA: Diagnosis not present

## 2024-01-04 DIAGNOSIS — I6782 Cerebral ischemia: Secondary | ICD-10-CM | POA: Diagnosis not present

## 2024-01-04 DIAGNOSIS — M8589 Other specified disorders of bone density and structure, multiple sites: Secondary | ICD-10-CM | POA: Diagnosis not present

## 2024-01-04 DIAGNOSIS — N6031 Fibrosclerosis of right breast: Secondary | ICD-10-CM | POA: Diagnosis not present

## 2024-01-04 DIAGNOSIS — I69351 Hemiplegia and hemiparesis following cerebral infarction affecting right dominant side: Secondary | ICD-10-CM | POA: Diagnosis not present

## 2024-01-04 DIAGNOSIS — C50811 Malignant neoplasm of overlapping sites of right female breast: Secondary | ICD-10-CM | POA: Diagnosis not present

## 2024-01-04 DIAGNOSIS — W19XXXA Unspecified fall, initial encounter: Secondary | ICD-10-CM | POA: Diagnosis not present

## 2024-01-04 DIAGNOSIS — D164 Benign neoplasm of bones of skull and face: Secondary | ICD-10-CM | POA: Diagnosis not present

## 2024-01-04 DIAGNOSIS — C50911 Malignant neoplasm of unspecified site of right female breast: Secondary | ICD-10-CM | POA: Diagnosis not present

## 2024-01-04 DIAGNOSIS — D0511 Intraductal carcinoma in situ of right breast: Secondary | ICD-10-CM | POA: Diagnosis not present

## 2024-01-05 DIAGNOSIS — N6312 Unspecified lump in the right breast, upper inner quadrant: Secondary | ICD-10-CM | POA: Diagnosis not present

## 2024-01-05 DIAGNOSIS — R928 Other abnormal and inconclusive findings on diagnostic imaging of breast: Secondary | ICD-10-CM | POA: Diagnosis not present

## 2024-01-05 DIAGNOSIS — W06XXXA Fall from bed, initial encounter: Secondary | ICD-10-CM | POA: Diagnosis not present

## 2024-01-05 DIAGNOSIS — M8589 Other specified disorders of bone density and structure, multiple sites: Secondary | ICD-10-CM | POA: Diagnosis not present

## 2024-01-05 DIAGNOSIS — D0511 Intraductal carcinoma in situ of right breast: Secondary | ICD-10-CM | POA: Diagnosis not present

## 2024-01-06 DIAGNOSIS — R928 Other abnormal and inconclusive findings on diagnostic imaging of breast: Secondary | ICD-10-CM | POA: Diagnosis not present

## 2024-01-06 DIAGNOSIS — N6315 Unspecified lump in the right breast, overlapping quadrants: Secondary | ICD-10-CM | POA: Diagnosis not present

## 2024-01-06 DIAGNOSIS — W06XXXA Fall from bed, initial encounter: Secondary | ICD-10-CM | POA: Diagnosis not present

## 2024-01-06 DIAGNOSIS — N6312 Unspecified lump in the right breast, upper inner quadrant: Secondary | ICD-10-CM | POA: Diagnosis not present

## 2024-01-06 DIAGNOSIS — M8589 Other specified disorders of bone density and structure, multiple sites: Secondary | ICD-10-CM | POA: Diagnosis not present

## 2024-01-06 DIAGNOSIS — D0511 Intraductal carcinoma in situ of right breast: Secondary | ICD-10-CM | POA: Diagnosis not present

## 2024-01-07 DIAGNOSIS — Z170 Estrogen receptor positive status [ER+]: Secondary | ICD-10-CM | POA: Diagnosis not present

## 2024-01-07 DIAGNOSIS — W06XXXA Fall from bed, initial encounter: Secondary | ICD-10-CM | POA: Diagnosis not present

## 2024-01-07 DIAGNOSIS — D0511 Intraductal carcinoma in situ of right breast: Secondary | ICD-10-CM | POA: Diagnosis not present

## 2024-01-07 DIAGNOSIS — N6312 Unspecified lump in the right breast, upper inner quadrant: Secondary | ICD-10-CM | POA: Diagnosis not present

## 2024-01-07 DIAGNOSIS — C50811 Malignant neoplasm of overlapping sites of right female breast: Secondary | ICD-10-CM | POA: Diagnosis not present

## 2024-01-07 DIAGNOSIS — R928 Other abnormal and inconclusive findings on diagnostic imaging of breast: Secondary | ICD-10-CM | POA: Diagnosis not present

## 2024-01-07 DIAGNOSIS — M8589 Other specified disorders of bone density and structure, multiple sites: Secondary | ICD-10-CM | POA: Diagnosis not present

## 2024-01-07 DIAGNOSIS — N6315 Unspecified lump in the right breast, overlapping quadrants: Secondary | ICD-10-CM | POA: Diagnosis not present

## 2024-01-07 DIAGNOSIS — N6031 Fibrosclerosis of right breast: Secondary | ICD-10-CM | POA: Diagnosis not present

## 2024-01-08 DIAGNOSIS — N6310 Unspecified lump in the right breast, unspecified quadrant: Secondary | ICD-10-CM | POA: Diagnosis not present

## 2024-01-08 DIAGNOSIS — C7981 Secondary malignant neoplasm of breast: Secondary | ICD-10-CM | POA: Diagnosis not present

## 2024-01-08 DIAGNOSIS — Z79811 Long term (current) use of aromatase inhibitors: Secondary | ICD-10-CM | POA: Diagnosis not present

## 2024-01-08 DIAGNOSIS — C50919 Malignant neoplasm of unspecified site of unspecified female breast: Secondary | ICD-10-CM | POA: Diagnosis not present

## 2024-01-08 DIAGNOSIS — C7951 Secondary malignant neoplasm of bone: Secondary | ICD-10-CM | POA: Diagnosis not present

## 2024-01-08 DIAGNOSIS — R531 Weakness: Secondary | ICD-10-CM | POA: Diagnosis not present

## 2024-01-08 DIAGNOSIS — D164 Benign neoplasm of bones of skull and face: Secondary | ICD-10-CM | POA: Diagnosis not present

## 2024-01-08 DIAGNOSIS — E785 Hyperlipidemia, unspecified: Secondary | ICD-10-CM | POA: Diagnosis not present

## 2024-01-08 DIAGNOSIS — M6282 Rhabdomyolysis: Secondary | ICD-10-CM | POA: Diagnosis not present

## 2024-01-08 DIAGNOSIS — R2689 Other abnormalities of gait and mobility: Secondary | ICD-10-CM | POA: Diagnosis not present

## 2024-01-08 DIAGNOSIS — R928 Other abnormal and inconclusive findings on diagnostic imaging of breast: Secondary | ICD-10-CM | POA: Diagnosis not present

## 2024-01-08 DIAGNOSIS — I10 Essential (primary) hypertension: Secondary | ICD-10-CM | POA: Diagnosis not present

## 2024-01-08 DIAGNOSIS — Z1732 Human epidermal growth factor receptor 2 negative status: Secondary | ICD-10-CM | POA: Diagnosis not present

## 2024-01-08 DIAGNOSIS — D0511 Intraductal carcinoma in situ of right breast: Secondary | ICD-10-CM | POA: Diagnosis not present

## 2024-01-08 DIAGNOSIS — M6281 Muscle weakness (generalized): Secondary | ICD-10-CM | POA: Diagnosis not present

## 2024-01-08 DIAGNOSIS — Z7982 Long term (current) use of aspirin: Secondary | ICD-10-CM | POA: Diagnosis not present

## 2024-01-08 DIAGNOSIS — E1139 Type 2 diabetes mellitus with other diabetic ophthalmic complication: Secondary | ICD-10-CM | POA: Diagnosis not present

## 2024-01-08 DIAGNOSIS — E1169 Type 2 diabetes mellitus with other specified complication: Secondary | ICD-10-CM | POA: Diagnosis not present

## 2024-01-08 DIAGNOSIS — R296 Repeated falls: Secondary | ICD-10-CM | POA: Diagnosis not present

## 2024-01-08 DIAGNOSIS — C50811 Malignant neoplasm of overlapping sites of right female breast: Secondary | ICD-10-CM | POA: Diagnosis not present

## 2024-01-08 DIAGNOSIS — R262 Difficulty in walking, not elsewhere classified: Secondary | ICD-10-CM | POA: Diagnosis not present

## 2024-01-08 DIAGNOSIS — E119 Type 2 diabetes mellitus without complications: Secondary | ICD-10-CM | POA: Diagnosis not present

## 2024-01-08 DIAGNOSIS — C801 Malignant (primary) neoplasm, unspecified: Secondary | ICD-10-CM | POA: Diagnosis not present

## 2024-01-08 DIAGNOSIS — W19XXXA Unspecified fall, initial encounter: Secondary | ICD-10-CM | POA: Diagnosis not present

## 2024-01-08 DIAGNOSIS — I6930 Unspecified sequelae of cerebral infarction: Secondary | ICD-10-CM | POA: Diagnosis not present

## 2024-01-08 DIAGNOSIS — Z743 Need for continuous supervision: Secondary | ICD-10-CM | POA: Diagnosis not present

## 2024-01-09 DIAGNOSIS — R296 Repeated falls: Secondary | ICD-10-CM | POA: Diagnosis not present

## 2024-01-09 DIAGNOSIS — M6282 Rhabdomyolysis: Secondary | ICD-10-CM | POA: Diagnosis not present

## 2024-01-09 DIAGNOSIS — R531 Weakness: Secondary | ICD-10-CM | POA: Diagnosis not present

## 2024-01-09 DIAGNOSIS — I10 Essential (primary) hypertension: Secondary | ICD-10-CM | POA: Diagnosis not present

## 2024-01-09 DIAGNOSIS — N6310 Unspecified lump in the right breast, unspecified quadrant: Secondary | ICD-10-CM | POA: Diagnosis not present

## 2024-01-09 DIAGNOSIS — C7951 Secondary malignant neoplasm of bone: Secondary | ICD-10-CM | POA: Diagnosis not present

## 2024-01-09 DIAGNOSIS — E119 Type 2 diabetes mellitus without complications: Secondary | ICD-10-CM | POA: Diagnosis not present

## 2024-01-09 DIAGNOSIS — E785 Hyperlipidemia, unspecified: Secondary | ICD-10-CM | POA: Diagnosis not present

## 2024-01-09 DIAGNOSIS — R262 Difficulty in walking, not elsewhere classified: Secondary | ICD-10-CM | POA: Diagnosis not present

## 2024-01-10 DIAGNOSIS — E1169 Type 2 diabetes mellitus with other specified complication: Secondary | ICD-10-CM | POA: Diagnosis not present

## 2024-01-11 DIAGNOSIS — E1169 Type 2 diabetes mellitus with other specified complication: Secondary | ICD-10-CM | POA: Diagnosis not present

## 2024-01-11 DIAGNOSIS — I6930 Unspecified sequelae of cerebral infarction: Secondary | ICD-10-CM | POA: Diagnosis not present

## 2024-01-11 DIAGNOSIS — R2689 Other abnormalities of gait and mobility: Secondary | ICD-10-CM | POA: Diagnosis not present

## 2024-01-11 DIAGNOSIS — N6310 Unspecified lump in the right breast, unspecified quadrant: Secondary | ICD-10-CM | POA: Diagnosis not present

## 2024-01-11 DIAGNOSIS — C7951 Secondary malignant neoplasm of bone: Secondary | ICD-10-CM | POA: Diagnosis not present

## 2024-01-11 DIAGNOSIS — E785 Hyperlipidemia, unspecified: Secondary | ICD-10-CM | POA: Diagnosis not present

## 2024-01-11 DIAGNOSIS — D164 Benign neoplasm of bones of skull and face: Secondary | ICD-10-CM | POA: Diagnosis not present

## 2024-01-11 DIAGNOSIS — I10 Essential (primary) hypertension: Secondary | ICD-10-CM | POA: Diagnosis not present

## 2024-01-11 DIAGNOSIS — M6281 Muscle weakness (generalized): Secondary | ICD-10-CM | POA: Diagnosis not present

## 2024-01-12 DIAGNOSIS — I6930 Unspecified sequelae of cerebral infarction: Secondary | ICD-10-CM | POA: Diagnosis not present

## 2024-01-12 DIAGNOSIS — I10 Essential (primary) hypertension: Secondary | ICD-10-CM | POA: Diagnosis not present

## 2024-01-12 DIAGNOSIS — N6310 Unspecified lump in the right breast, unspecified quadrant: Secondary | ICD-10-CM | POA: Diagnosis not present

## 2024-01-12 DIAGNOSIS — R2689 Other abnormalities of gait and mobility: Secondary | ICD-10-CM | POA: Diagnosis not present

## 2024-01-12 DIAGNOSIS — C7951 Secondary malignant neoplasm of bone: Secondary | ICD-10-CM | POA: Diagnosis not present

## 2024-01-12 DIAGNOSIS — E1169 Type 2 diabetes mellitus with other specified complication: Secondary | ICD-10-CM | POA: Diagnosis not present

## 2024-01-12 DIAGNOSIS — M6281 Muscle weakness (generalized): Secondary | ICD-10-CM | POA: Diagnosis not present

## 2024-01-12 DIAGNOSIS — E785 Hyperlipidemia, unspecified: Secondary | ICD-10-CM | POA: Diagnosis not present

## 2024-01-14 DIAGNOSIS — R928 Other abnormal and inconclusive findings on diagnostic imaging of breast: Secondary | ICD-10-CM | POA: Diagnosis not present

## 2024-01-14 DIAGNOSIS — D0511 Intraductal carcinoma in situ of right breast: Secondary | ICD-10-CM | POA: Diagnosis not present

## 2024-01-15 DIAGNOSIS — N6310 Unspecified lump in the right breast, unspecified quadrant: Secondary | ICD-10-CM | POA: Diagnosis not present

## 2024-01-15 DIAGNOSIS — E1169 Type 2 diabetes mellitus with other specified complication: Secondary | ICD-10-CM | POA: Diagnosis not present

## 2024-01-15 DIAGNOSIS — M6281 Muscle weakness (generalized): Secondary | ICD-10-CM | POA: Diagnosis not present

## 2024-01-15 DIAGNOSIS — E785 Hyperlipidemia, unspecified: Secondary | ICD-10-CM | POA: Diagnosis not present

## 2024-01-16 DIAGNOSIS — C50811 Malignant neoplasm of overlapping sites of right female breast: Secondary | ICD-10-CM | POA: Diagnosis not present

## 2024-01-16 DIAGNOSIS — C7951 Secondary malignant neoplasm of bone: Secondary | ICD-10-CM | POA: Diagnosis not present

## 2024-01-16 DIAGNOSIS — C7981 Secondary malignant neoplasm of breast: Secondary | ICD-10-CM | POA: Diagnosis not present

## 2024-01-16 DIAGNOSIS — C801 Malignant (primary) neoplasm, unspecified: Secondary | ICD-10-CM | POA: Diagnosis not present

## 2024-01-23 DIAGNOSIS — N6310 Unspecified lump in the right breast, unspecified quadrant: Secondary | ICD-10-CM | POA: Diagnosis not present

## 2024-01-23 DIAGNOSIS — C7951 Secondary malignant neoplasm of bone: Secondary | ICD-10-CM | POA: Diagnosis not present

## 2024-01-23 DIAGNOSIS — C50811 Malignant neoplasm of overlapping sites of right female breast: Secondary | ICD-10-CM | POA: Diagnosis not present

## 2024-01-23 DIAGNOSIS — R262 Difficulty in walking, not elsewhere classified: Secondary | ICD-10-CM | POA: Diagnosis not present

## 2024-01-23 DIAGNOSIS — Z1732 Human epidermal growth factor receptor 2 negative status: Secondary | ICD-10-CM | POA: Diagnosis not present

## 2024-01-23 DIAGNOSIS — I6930 Unspecified sequelae of cerebral infarction: Secondary | ICD-10-CM | POA: Diagnosis not present

## 2024-01-23 DIAGNOSIS — M6282 Rhabdomyolysis: Secondary | ICD-10-CM | POA: Diagnosis not present

## 2024-01-23 DIAGNOSIS — E119 Type 2 diabetes mellitus without complications: Secondary | ICD-10-CM | POA: Diagnosis not present

## 2024-01-23 DIAGNOSIS — C50919 Malignant neoplasm of unspecified site of unspecified female breast: Secondary | ICD-10-CM | POA: Diagnosis not present

## 2024-01-23 DIAGNOSIS — Z79811 Long term (current) use of aromatase inhibitors: Secondary | ICD-10-CM | POA: Diagnosis not present

## 2024-01-23 DIAGNOSIS — E785 Hyperlipidemia, unspecified: Secondary | ICD-10-CM | POA: Diagnosis not present

## 2024-01-23 DIAGNOSIS — I10 Essential (primary) hypertension: Secondary | ICD-10-CM | POA: Diagnosis not present

## 2024-01-23 DIAGNOSIS — Z7982 Long term (current) use of aspirin: Secondary | ICD-10-CM | POA: Diagnosis not present

## 2024-01-23 DIAGNOSIS — E1169 Type 2 diabetes mellitus with other specified complication: Secondary | ICD-10-CM | POA: Diagnosis not present

## 2024-01-23 DIAGNOSIS — R531 Weakness: Secondary | ICD-10-CM | POA: Diagnosis not present

## 2024-01-26 DIAGNOSIS — C50411 Malignant neoplasm of upper-outer quadrant of right female breast: Secondary | ICD-10-CM | POA: Diagnosis not present

## 2024-01-26 DIAGNOSIS — E1110 Type 2 diabetes mellitus with ketoacidosis without coma: Secondary | ICD-10-CM | POA: Diagnosis not present

## 2024-01-26 DIAGNOSIS — G893 Neoplasm related pain (acute) (chronic): Secondary | ICD-10-CM | POA: Diagnosis not present

## 2024-01-26 DIAGNOSIS — Z170 Estrogen receptor positive status [ER+]: Secondary | ICD-10-CM | POA: Diagnosis not present

## 2024-01-26 DIAGNOSIS — C419 Malignant neoplasm of bone and articular cartilage, unspecified: Secondary | ICD-10-CM | POA: Diagnosis not present

## 2024-01-29 DIAGNOSIS — Z170 Estrogen receptor positive status [ER+]: Secondary | ICD-10-CM | POA: Diagnosis not present

## 2024-01-29 DIAGNOSIS — C419 Malignant neoplasm of bone and articular cartilage, unspecified: Secondary | ICD-10-CM | POA: Diagnosis not present

## 2024-01-29 DIAGNOSIS — C50411 Malignant neoplasm of upper-outer quadrant of right female breast: Secondary | ICD-10-CM | POA: Diagnosis not present

## 2024-01-29 DIAGNOSIS — R531 Weakness: Secondary | ICD-10-CM | POA: Diagnosis not present

## 2024-01-30 DIAGNOSIS — C50911 Malignant neoplasm of unspecified site of right female breast: Secondary | ICD-10-CM | POA: Diagnosis not present

## 2024-02-02 DIAGNOSIS — R531 Weakness: Secondary | ICD-10-CM | POA: Diagnosis not present

## 2024-02-02 DIAGNOSIS — E162 Hypoglycemia, unspecified: Secondary | ICD-10-CM | POA: Diagnosis not present

## 2024-02-02 DIAGNOSIS — C50919 Malignant neoplasm of unspecified site of unspecified female breast: Secondary | ICD-10-CM | POA: Diagnosis not present

## 2024-02-02 DIAGNOSIS — R4182 Altered mental status, unspecified: Secondary | ICD-10-CM | POA: Diagnosis not present

## 2024-02-02 DIAGNOSIS — C801 Malignant (primary) neoplasm, unspecified: Secondary | ICD-10-CM | POA: Diagnosis not present

## 2024-02-02 DIAGNOSIS — R079 Chest pain, unspecified: Secondary | ICD-10-CM | POA: Diagnosis not present

## 2024-02-02 DIAGNOSIS — Z853 Personal history of malignant neoplasm of breast: Secondary | ICD-10-CM | POA: Diagnosis not present

## 2024-02-02 DIAGNOSIS — R5383 Other fatigue: Secondary | ICD-10-CM | POA: Diagnosis not present

## 2024-02-02 DIAGNOSIS — C7951 Secondary malignant neoplasm of bone: Secondary | ICD-10-CM | POA: Diagnosis not present

## 2024-02-03 DIAGNOSIS — C801 Malignant (primary) neoplasm, unspecified: Secondary | ICD-10-CM | POA: Diagnosis not present

## 2024-02-03 DIAGNOSIS — C7951 Secondary malignant neoplasm of bone: Secondary | ICD-10-CM | POA: Diagnosis not present

## 2024-02-03 DIAGNOSIS — R531 Weakness: Secondary | ICD-10-CM | POA: Diagnosis not present

## 2024-02-04 DIAGNOSIS — I10 Essential (primary) hypertension: Secondary | ICD-10-CM | POA: Diagnosis not present

## 2024-02-04 DIAGNOSIS — D164 Benign neoplasm of bones of skull and face: Secondary | ICD-10-CM | POA: Diagnosis not present

## 2024-02-04 DIAGNOSIS — C7951 Secondary malignant neoplasm of bone: Secondary | ICD-10-CM | POA: Diagnosis not present

## 2024-02-04 DIAGNOSIS — R531 Weakness: Secondary | ICD-10-CM | POA: Diagnosis not present

## 2024-02-04 DIAGNOSIS — E1169 Type 2 diabetes mellitus with other specified complication: Secondary | ICD-10-CM | POA: Diagnosis not present

## 2024-02-06 DIAGNOSIS — C50411 Malignant neoplasm of upper-outer quadrant of right female breast: Secondary | ICD-10-CM | POA: Diagnosis not present

## 2024-02-06 DIAGNOSIS — G893 Neoplasm related pain (acute) (chronic): Secondary | ICD-10-CM | POA: Diagnosis not present

## 2024-02-06 DIAGNOSIS — E119 Type 2 diabetes mellitus without complications: Secondary | ICD-10-CM | POA: Diagnosis not present

## 2024-02-06 DIAGNOSIS — Z515 Encounter for palliative care: Secondary | ICD-10-CM | POA: Diagnosis not present

## 2024-02-06 DIAGNOSIS — Z170 Estrogen receptor positive status [ER+]: Secondary | ICD-10-CM | POA: Diagnosis not present

## 2024-02-06 DIAGNOSIS — R531 Weakness: Secondary | ICD-10-CM | POA: Diagnosis not present

## 2024-02-06 DIAGNOSIS — C419 Malignant neoplasm of bone and articular cartilage, unspecified: Secondary | ICD-10-CM | POA: Diagnosis not present

## 2024-02-08 DIAGNOSIS — C7951 Secondary malignant neoplasm of bone: Secondary | ICD-10-CM | POA: Diagnosis not present

## 2024-02-08 DIAGNOSIS — I10 Essential (primary) hypertension: Secondary | ICD-10-CM | POA: Diagnosis not present

## 2024-02-08 DIAGNOSIS — D630 Anemia in neoplastic disease: Secondary | ICD-10-CM | POA: Diagnosis not present

## 2024-02-08 DIAGNOSIS — E11649 Type 2 diabetes mellitus with hypoglycemia without coma: Secondary | ICD-10-CM | POA: Diagnosis not present

## 2024-02-08 DIAGNOSIS — Z7984 Long term (current) use of oral hypoglycemic drugs: Secondary | ICD-10-CM | POA: Diagnosis not present

## 2024-02-08 DIAGNOSIS — T402X5D Adverse effect of other opioids, subsequent encounter: Secondary | ICD-10-CM | POA: Diagnosis not present

## 2024-02-08 DIAGNOSIS — Z87891 Personal history of nicotine dependence: Secondary | ICD-10-CM | POA: Diagnosis not present

## 2024-02-08 DIAGNOSIS — C50911 Malignant neoplasm of unspecified site of right female breast: Secondary | ICD-10-CM | POA: Diagnosis not present

## 2024-02-08 DIAGNOSIS — K5903 Drug induced constipation: Secondary | ICD-10-CM | POA: Diagnosis not present

## 2024-02-09 DIAGNOSIS — C7951 Secondary malignant neoplasm of bone: Secondary | ICD-10-CM | POA: Diagnosis not present

## 2024-02-09 DIAGNOSIS — E119 Type 2 diabetes mellitus without complications: Secondary | ICD-10-CM | POA: Diagnosis not present

## 2024-02-09 DIAGNOSIS — C50411 Malignant neoplasm of upper-outer quadrant of right female breast: Secondary | ICD-10-CM | POA: Diagnosis not present

## 2024-02-09 DIAGNOSIS — I10 Essential (primary) hypertension: Secondary | ICD-10-CM | POA: Diagnosis not present

## 2024-02-09 DIAGNOSIS — E11649 Type 2 diabetes mellitus with hypoglycemia without coma: Secondary | ICD-10-CM | POA: Diagnosis not present

## 2024-02-09 DIAGNOSIS — Z170 Estrogen receptor positive status [ER+]: Secondary | ICD-10-CM | POA: Diagnosis not present

## 2024-02-09 DIAGNOSIS — Z79899 Other long term (current) drug therapy: Secondary | ICD-10-CM | POA: Diagnosis not present

## 2024-02-09 DIAGNOSIS — Z7984 Long term (current) use of oral hypoglycemic drugs: Secondary | ICD-10-CM | POA: Diagnosis not present

## 2024-02-10 DIAGNOSIS — E11649 Type 2 diabetes mellitus with hypoglycemia without coma: Secondary | ICD-10-CM | POA: Diagnosis not present

## 2024-02-10 DIAGNOSIS — E1169 Type 2 diabetes mellitus with other specified complication: Secondary | ICD-10-CM | POA: Diagnosis not present

## 2024-02-10 DIAGNOSIS — K5903 Drug induced constipation: Secondary | ICD-10-CM | POA: Diagnosis not present

## 2024-02-10 DIAGNOSIS — T402X5D Adverse effect of other opioids, subsequent encounter: Secondary | ICD-10-CM | POA: Diagnosis not present

## 2024-02-10 DIAGNOSIS — E785 Hyperlipidemia, unspecified: Secondary | ICD-10-CM | POA: Diagnosis not present

## 2024-02-10 DIAGNOSIS — Z87891 Personal history of nicotine dependence: Secondary | ICD-10-CM | POA: Diagnosis not present

## 2024-02-10 DIAGNOSIS — N6310 Unspecified lump in the right breast, unspecified quadrant: Secondary | ICD-10-CM | POA: Diagnosis not present

## 2024-02-10 DIAGNOSIS — I10 Essential (primary) hypertension: Secondary | ICD-10-CM | POA: Diagnosis not present

## 2024-02-10 DIAGNOSIS — M6281 Muscle weakness (generalized): Secondary | ICD-10-CM | POA: Diagnosis not present

## 2024-02-10 DIAGNOSIS — Z7984 Long term (current) use of oral hypoglycemic drugs: Secondary | ICD-10-CM | POA: Diagnosis not present

## 2024-02-10 DIAGNOSIS — I6930 Unspecified sequelae of cerebral infarction: Secondary | ICD-10-CM | POA: Diagnosis not present

## 2024-02-10 DIAGNOSIS — D630 Anemia in neoplastic disease: Secondary | ICD-10-CM | POA: Diagnosis not present

## 2024-02-10 DIAGNOSIS — C50911 Malignant neoplasm of unspecified site of right female breast: Secondary | ICD-10-CM | POA: Diagnosis not present

## 2024-02-10 DIAGNOSIS — D164 Benign neoplasm of bones of skull and face: Secondary | ICD-10-CM | POA: Diagnosis not present

## 2024-02-10 DIAGNOSIS — C7951 Secondary malignant neoplasm of bone: Secondary | ICD-10-CM | POA: Diagnosis not present

## 2024-02-10 DIAGNOSIS — R2689 Other abnormalities of gait and mobility: Secondary | ICD-10-CM | POA: Diagnosis not present

## 2024-02-12 DIAGNOSIS — H53012 Deprivation amblyopia, left eye: Secondary | ICD-10-CM | POA: Diagnosis not present

## 2024-02-12 DIAGNOSIS — E119 Type 2 diabetes mellitus without complications: Secondary | ICD-10-CM | POA: Diagnosis not present

## 2024-02-12 DIAGNOSIS — H2513 Age-related nuclear cataract, bilateral: Secondary | ICD-10-CM | POA: Diagnosis not present

## 2024-02-16 DIAGNOSIS — C419 Malignant neoplasm of bone and articular cartilage, unspecified: Secondary | ICD-10-CM | POA: Diagnosis not present

## 2024-02-16 DIAGNOSIS — Z170 Estrogen receptor positive status [ER+]: Secondary | ICD-10-CM | POA: Diagnosis not present

## 2024-02-16 DIAGNOSIS — C50411 Malignant neoplasm of upper-outer quadrant of right female breast: Secondary | ICD-10-CM | POA: Diagnosis not present

## 2024-02-16 DIAGNOSIS — G893 Neoplasm related pain (acute) (chronic): Secondary | ICD-10-CM | POA: Diagnosis not present

## 2024-02-16 DIAGNOSIS — E860 Dehydration: Secondary | ICD-10-CM | POA: Diagnosis not present

## 2024-02-18 DIAGNOSIS — R011 Cardiac murmur, unspecified: Secondary | ICD-10-CM | POA: Diagnosis not present

## 2024-02-18 DIAGNOSIS — I081 Rheumatic disorders of both mitral and tricuspid valves: Secondary | ICD-10-CM | POA: Diagnosis not present

## 2024-02-23 DIAGNOSIS — C50411 Malignant neoplasm of upper-outer quadrant of right female breast: Secondary | ICD-10-CM | POA: Diagnosis not present

## 2024-02-23 DIAGNOSIS — Z170 Estrogen receptor positive status [ER+]: Secondary | ICD-10-CM | POA: Diagnosis not present

## 2024-02-25 DIAGNOSIS — Z170 Estrogen receptor positive status [ER+]: Secondary | ICD-10-CM | POA: Diagnosis not present

## 2024-02-25 DIAGNOSIS — C50411 Malignant neoplasm of upper-outer quadrant of right female breast: Secondary | ICD-10-CM | POA: Diagnosis not present

## 2024-02-26 DIAGNOSIS — G893 Neoplasm related pain (acute) (chronic): Secondary | ICD-10-CM | POA: Diagnosis not present

## 2024-02-26 DIAGNOSIS — E860 Dehydration: Secondary | ICD-10-CM | POA: Diagnosis not present

## 2024-02-26 DIAGNOSIS — C50411 Malignant neoplasm of upper-outer quadrant of right female breast: Secondary | ICD-10-CM | POA: Diagnosis not present

## 2024-02-26 DIAGNOSIS — Z170 Estrogen receptor positive status [ER+]: Secondary | ICD-10-CM | POA: Diagnosis not present

## 2024-02-26 DIAGNOSIS — C419 Malignant neoplasm of bone and articular cartilage, unspecified: Secondary | ICD-10-CM | POA: Diagnosis not present

## 2024-03-01 DIAGNOSIS — Z170 Estrogen receptor positive status [ER+]: Secondary | ICD-10-CM | POA: Diagnosis not present

## 2024-03-01 DIAGNOSIS — E860 Dehydration: Secondary | ICD-10-CM | POA: Diagnosis not present

## 2024-03-01 DIAGNOSIS — C50411 Malignant neoplasm of upper-outer quadrant of right female breast: Secondary | ICD-10-CM | POA: Diagnosis not present

## 2024-03-01 DIAGNOSIS — C419 Malignant neoplasm of bone and articular cartilage, unspecified: Secondary | ICD-10-CM | POA: Diagnosis not present

## 2024-03-01 DIAGNOSIS — G893 Neoplasm related pain (acute) (chronic): Secondary | ICD-10-CM | POA: Diagnosis not present

## 2024-03-23 DIAGNOSIS — C50919 Malignant neoplasm of unspecified site of unspecified female breast: Secondary | ICD-10-CM | POA: Diagnosis not present

## 2024-04-07 DIAGNOSIS — Z79811 Long term (current) use of aromatase inhibitors: Secondary | ICD-10-CM | POA: Diagnosis not present

## 2024-04-07 DIAGNOSIS — Z7901 Long term (current) use of anticoagulants: Secondary | ICD-10-CM | POA: Diagnosis not present

## 2024-04-07 DIAGNOSIS — E119 Type 2 diabetes mellitus without complications: Secondary | ICD-10-CM | POA: Diagnosis not present

## 2024-04-07 DIAGNOSIS — I10 Essential (primary) hypertension: Secondary | ICD-10-CM | POA: Diagnosis not present

## 2024-04-07 DIAGNOSIS — C50811 Malignant neoplasm of overlapping sites of right female breast: Secondary | ICD-10-CM | POA: Diagnosis not present

## 2024-04-07 DIAGNOSIS — C50919 Malignant neoplasm of unspecified site of unspecified female breast: Secondary | ICD-10-CM | POA: Diagnosis not present

## 2024-04-07 DIAGNOSIS — M6282 Rhabdomyolysis: Secondary | ICD-10-CM | POA: Diagnosis not present

## 2024-04-07 DIAGNOSIS — R531 Weakness: Secondary | ICD-10-CM | POA: Diagnosis not present

## 2024-04-07 DIAGNOSIS — Z1732 Human epidermal growth factor receptor 2 negative status: Secondary | ICD-10-CM | POA: Diagnosis not present

## 2024-04-07 DIAGNOSIS — E785 Hyperlipidemia, unspecified: Secondary | ICD-10-CM | POA: Diagnosis not present
